# Patient Record
Sex: Male | Born: 1970 | Race: White | Hispanic: No | Marital: Single | Smoking: Current every day smoker
Health system: Southern US, Community
[De-identification: ages and names within clinical notes are randomized; demographics above are authoritative.]

## PROBLEM LIST (undated history)

## (undated) DIAGNOSIS — R7301 Impaired fasting glucose: Secondary | ICD-10-CM

## (undated) DIAGNOSIS — I10 Essential (primary) hypertension: Secondary | ICD-10-CM

## (undated) DIAGNOSIS — K5900 Constipation, unspecified: Secondary | ICD-10-CM

## (undated) DIAGNOSIS — F251 Schizoaffective disorder, depressive type: Secondary | ICD-10-CM

## (undated) DIAGNOSIS — H25012 Cortical age-related cataract, left eye: Secondary | ICD-10-CM

## (undated) DIAGNOSIS — Z720 Tobacco use: Secondary | ICD-10-CM

## (undated) DIAGNOSIS — M5136 Other intervertebral disc degeneration, lumbar region: Secondary | ICD-10-CM

## (undated) DIAGNOSIS — F200 Paranoid schizophrenia: Secondary | ICD-10-CM

## (undated) HISTORY — DX: Constipation, unspecified: K59.00

## (undated) HISTORY — DX: Paranoid schizophrenia: F20.0

## (undated) HISTORY — DX: Essential (primary) hypertension: I10

## (undated) HISTORY — DX: Schizoaffective disorder, depressive type: F25.1

## (undated) HISTORY — DX: Cortical age-related cataract, left eye: H25.012

## (undated) HISTORY — PX: APPENDECTOMY: SHX54

## (undated) HISTORY — DX: Impaired fasting glucose: R73.01

## (undated) HISTORY — DX: Tobacco use: Z72.0

## (undated) HISTORY — DX: Other intervertebral disc degeneration, lumbar region: M51.36

---

## 2003-03-14 ENCOUNTER — Inpatient Hospital Stay (HOSPITAL_COMMUNITY): Admission: EM | Admit: 2003-03-14 | Discharge: 2003-03-24 | Payer: Self-pay | Admitting: Psychiatry

## 2013-03-10 DIAGNOSIS — R7301 Impaired fasting glucose: Secondary | ICD-10-CM

## 2013-03-10 HISTORY — DX: Impaired fasting glucose: R73.01

## 2014-07-11 DIAGNOSIS — H25012 Cortical age-related cataract, left eye: Secondary | ICD-10-CM

## 2014-07-11 DIAGNOSIS — H2512 Age-related nuclear cataract, left eye: Secondary | ICD-10-CM | POA: Insufficient documentation

## 2014-07-11 HISTORY — DX: Cortical age-related cataract, left eye: H25.012

## 2014-07-13 DIAGNOSIS — H52222 Regular astigmatism, left eye: Secondary | ICD-10-CM | POA: Insufficient documentation

## 2014-07-13 DIAGNOSIS — IMO0002 Reserved for concepts with insufficient information to code with codable children: Secondary | ICD-10-CM | POA: Insufficient documentation

## 2017-04-09 ENCOUNTER — Ambulatory Visit (INDEPENDENT_AMBULATORY_CARE_PROVIDER_SITE_OTHER): Payer: Medicare Other | Admitting: Physician Assistant

## 2017-04-09 ENCOUNTER — Encounter: Payer: Self-pay | Admitting: Physician Assistant

## 2017-04-09 VITALS — BP 130/82 | HR 86 | Temp 98.1°F | Ht 70.0 in | Wt 172.0 lb

## 2017-04-09 DIAGNOSIS — R1032 Left lower quadrant pain: Secondary | ICD-10-CM

## 2017-04-09 DIAGNOSIS — Z7689 Persons encountering health services in other specified circumstances: Secondary | ICD-10-CM | POA: Diagnosis not present

## 2017-04-09 DIAGNOSIS — G8929 Other chronic pain: Secondary | ICD-10-CM | POA: Insufficient documentation

## 2017-04-09 DIAGNOSIS — K921 Melena: Secondary | ICD-10-CM

## 2017-04-09 DIAGNOSIS — Z8639 Personal history of other endocrine, nutritional and metabolic disease: Secondary | ICD-10-CM

## 2017-04-09 DIAGNOSIS — Z5181 Encounter for therapeutic drug level monitoring: Secondary | ICD-10-CM | POA: Diagnosis not present

## 2017-04-09 DIAGNOSIS — F209 Schizophrenia, unspecified: Secondary | ICD-10-CM | POA: Insufficient documentation

## 2017-04-09 DIAGNOSIS — F17200 Nicotine dependence, unspecified, uncomplicated: Secondary | ICD-10-CM | POA: Insufficient documentation

## 2017-04-09 DIAGNOSIS — F251 Schizoaffective disorder, depressive type: Secondary | ICD-10-CM | POA: Diagnosis not present

## 2017-04-09 DIAGNOSIS — Z13220 Encounter for screening for lipoid disorders: Secondary | ICD-10-CM

## 2017-04-09 HISTORY — DX: Schizoaffective disorder, depressive type: F25.1

## 2017-04-09 LAB — POCT URINALYSIS DIPSTICK
BILIRUBIN UA: NEGATIVE
Blood, UA: NEGATIVE
GLUCOSE UA: NEGATIVE
Ketones, UA: NEGATIVE
LEUKOCYTES UA: NEGATIVE
Nitrite, UA: NEGATIVE
Protein, UA: NEGATIVE
SPEC GRAV UA: 1.01 (ref 1.010–1.025)
Urobilinogen, UA: 0.2 E.U./dL
pH, UA: 7 (ref 5.0–8.0)

## 2017-04-09 NOTE — Progress Notes (Signed)
HPI:                                                                Darius MerlWalter R Lancour Jr. is a 47 y.o. male who [facility]: Primary Care Sports Medicine today to establish care  Current concerns include: abdominal pain  Patient c/o chronic left-sided abdominal pain since August 2017. States pain is worsening. Pain is constant, dull pain, 7/10. Endorses occasional sharp pain. Aggravated by laying on his stomach, bending forward, and heat. Not worsened by activity. Reports hematochezia about 1 month ago, otherwise no change in bowel habits. Has been taking Meloxicam for 2 years. Reports regular BM daily. Does not drink alcohol. Endorses chills. Drinks > 10 cups of coffee/soda per day. Smokes 1.5 packs per day He had a negative CT abdomen/pelvis on 12/01/2015  Followed by [facility] for schizoaffective disorder. Taking Depakote and Cymbalta. He brought a prescription for labs to be ordered and faxed to his psychiatrist for medication monitoring.  Depression screen PHQ 2/9 04/09/2017  Decreased Interest 0  Down, Depressed, Hopeless 0  PHQ - 2 Score 0     Past Medical History:  Diagnosis Date  . Cataract cortical, senile, left 07/11/2014  . Constipation   . IFG (impaired fasting glucose) 03/10/2013  . Lumbar degenerative disc disease 04/15/2017  . Paranoid schizophrenia (HCC)   . Schizoaffective disorder, depressive type (HCC) 04/09/2017  . Tobacco use    History reviewed. No pertinent surgical history. Social History   Tobacco Use  . Smoking status: Current Every Day Smoker    Packs/day: 1.50    Years: 30.00    Pack years: 45.00    Types: Cigarettes  . Smokeless tobacco: Never Used  Substance Use Topics  . Alcohol use: No    Frequency: Never   family history includes Diabetes in his mother; Hypertension in his father.  ROS: negative except as noted in the HPI  Medications: Current Outpatient Medications  Medication Sig Dispense Refill   . divalproex (DEPAKOTE ER) 500 MG 24 hr tablet Take 1 tablet by mouth once.    . DULoxetine (CYMBALTA) 30 MG capsule One capsule daily in addition to 60mg  capsule for total daily dose of 90mg     . DULoxetine (CYMBALTA) 60 MG capsule Take 60 mg by mouth daily.    Marland Kitchen. EPINEPHrine 0.3 mg/0.3 mL IJ SOAJ injection Inject into the muscle.    . paliperidone (INVEGA) 6 MG 24 hr tablet Take 6 mg by mouth daily.    Marland Kitchen. BANOPHEN 50 MG capsule Take 1 capsule by mouth at bedtime.  0  . esomeprazole (NEXIUM) 40 MG capsule One tab by mouth at dinner time. 90 capsule 3  . gabapentin (NEURONTIN) 100 MG capsule Take 1 capsule by mouth 3 (three) times daily.  0  . polyethylene glycol (MIRALAX / GLYCOLAX) packet Take 17 g by mouth 2 (two) times daily. Until stooling regularly 30 packet 11  . senna-docusate (SENOKOT-S) 8.6-50 MG tablet Take 2 tablets by mouth 2 (two) times daily. Until stooling regularly 60 tablet 0   No current facility-administered medications for this visit.    Allergies  Allergen Reactions  . Bee Venom Anaphylaxis  . Diclofenac Sodium     GI Bleed  Objective:  BP 130/82   Pulse 86   Temp 98.1 F (36.7 C) (Oral)   Ht 5\' 10"  (1.778 m)   Wt 172 lb (78 kg)   SpO2 95%   BMI 24.68 kg/m  Gen:  alert, not ill-appearing, no distress, appropriate for age HEENT: head normocephalic without obvious abnormality, conjunctiva and cornea clear, trachea midline Pulm: Normal work of breathing, normal phonation, clear to auscultation bilaterally, no wheezes, rales or rhonchi CV: Normal rate, regular rhythm, s1 and s2 distinct, no murmurs, clicks or rubs  GI: abdomen normal appearing, bowel sounds active, abdomen soft, there is point tenderness of the left lumbar region, no rebound, no guarding, no organomegaly Neuro: alert and oriented x 3, no tremor MSK: extremities atraumatic, normal gait and station Skin: intact, no rashes on exposed skin, no jaundice, no cyanosis  Acute Interface,  Incoming Rad Results - 12/01/2015 12:53 AM EDT COMPARISON: None  INDICATION: right flank pain, trauma  TECHNIQUE: Axial CT abdomen and pelvis with IV contrast, utilizing 80 mL of Isovue-370 Sagittal and coronal reconstructions generated and reviewed.  Dose reduction was utilized (automated exposure control, mA or kV adjustment based on patient size, or iterative image reconstruction).   FINDINGS:   ABDOMEN AND PELVIS:  Lower chest: Mild dependent atelectasis  Liver: Mild fatty infiltration of the liver  Gallbladder and biliary tree: Unremarkable  Pancreas: Unremarkable  Spleen: Unremarkable  Adrenals: Unremarkable  Kidneys and urinary bladder: Unremarkable  Reproductive: Unremarkable  Abdominal aorta: Mild atherosclerotic disease  Lymph nodes: Unremarkable  Peritoneum: No free fluid. No free intraperitoneal air.  Gastrointestinal tract: Unremarkable. Appendectomy.  Abdominal and pelvic walls: Unremarkable  Bones: Mild multilevel degenerative changes    IMPRESSION:  1.  Negative for acute injury or acute process.  No results found for this or any previous visit (from the past 72 hour(s)). No results found.    Assessment and Plan: 47 y.o. male with   1. Encounter to establish care - reviewed PMH, PSH, PFH, medications and allergies - reviewed health maintenance - Tdap UTD - declines influenza - personally reviewed labs and imaging results from 2017 in Care Everywhere  2. Schizoaffective disorder, depressive type (HCC) - labs results to be faxed to [facility] - COMPLETE METABOLIC PANEL WITH GFR - CBC with Differential - Vitamin D (25 hydroxy); Future - TSH + free T4 - Valproic acid level  3. Encounter for medication monitoring - COMPLETE METABOLIC PANEL WITH GFR - CBC with Differential - Vitamin D (25 hydroxy); Future - TSH + free T4 - Valproic acid level  4. History of vitamin D deficiency - Vitamin D (25  hydroxy); Future  5. Left lower quadrant pain - given recent episode of hematochezia and worsening symptoms, cannot r/o diverticulitis.  - CT Abdomen Pelvis W Contrast; Future - Lipase - POCT Urinalysis Dipstick  6. Screening, lipid - Lipid Panel w/reflex Direct LDL  7. Hematochezia - CBC with Differential - CT Abdomen Pelvis W Contrast; Future  8. Tobacco use disorder - declines smoking cessation   Patient education and anticipatory guidance given Patient agrees with treatment plan Follow-up in 1 week for abdominal pain or sooner as needed if symptoms worsen or fail to improve  Levonne Hubert PA-C

## 2017-04-09 NOTE — Patient Instructions (Addendum)
-   Stop meloxicam and avoid all over-the-counter pain relievers for now - Set up appointment with Sports Medicine to discuss arthritis/joint pain - CT abdomen will be scheduled - Labs today - Complete stool cards and return to office - Reduce your caffeine intake

## 2017-04-10 ENCOUNTER — Encounter (HOSPITAL_BASED_OUTPATIENT_CLINIC_OR_DEPARTMENT_OTHER): Payer: Self-pay

## 2017-04-10 ENCOUNTER — Encounter: Payer: Self-pay | Admitting: Physician Assistant

## 2017-04-10 ENCOUNTER — Ambulatory Visit (HOSPITAL_BASED_OUTPATIENT_CLINIC_OR_DEPARTMENT_OTHER)
Admission: RE | Admit: 2017-04-10 | Discharge: 2017-04-10 | Disposition: A | Discharge status: Home / Self Care | Payer: Medicare Other | Source: Ambulatory Visit | Attending: Physician Assistant | Admitting: Physician Assistant

## 2017-04-10 DIAGNOSIS — R1032 Left lower quadrant pain: Secondary | ICD-10-CM | POA: Diagnosis present

## 2017-04-10 LAB — VALPROIC ACID LEVEL: VALPROIC ACID LVL: 18.9 mg/L — AB (ref 50.0–100.0)

## 2017-04-10 LAB — CBC WITH DIFFERENTIAL/PLATELET
BASOS ABS: 20 {cells}/uL (ref 0–200)
Basophils Relative: 0.3 %
EOS ABS: 79 {cells}/uL (ref 15–500)
Eosinophils Relative: 1.2 %
HEMATOCRIT: 39.6 % (ref 38.5–50.0)
HEMOGLOBIN: 13.8 g/dL (ref 13.2–17.1)
LYMPHS ABS: 1921 {cells}/uL (ref 850–3900)
MCH: 30.2 pg (ref 27.0–33.0)
MCHC: 34.8 g/dL (ref 32.0–36.0)
MCV: 86.7 fL (ref 80.0–100.0)
MONOS PCT: 6.2 %
MPV: 9.5 fL (ref 7.5–12.5)
NEUTROS ABS: 4171 {cells}/uL (ref 1500–7800)
Neutrophils Relative %: 63.2 %
Platelets: 346 10*3/uL (ref 140–400)
RBC: 4.57 10*6/uL (ref 4.20–5.80)
RDW: 13 % (ref 11.0–15.0)
Total Lymphocyte: 29.1 %
WBC mixed population: 409 cells/uL (ref 200–950)
WBC: 6.6 10*3/uL (ref 3.8–10.8)

## 2017-04-10 LAB — COMPLETE METABOLIC PANEL WITH GFR
AG RATIO: 1.9 (calc) (ref 1.0–2.5)
ALT: 8 U/L — AB (ref 9–46)
AST: 14 U/L (ref 10–40)
Albumin: 4.6 g/dL (ref 3.6–5.1)
Alkaline phosphatase (APISO): 53 U/L (ref 40–115)
BUN/Creatinine Ratio: 6 (calc) (ref 6–22)
BUN: 5 mg/dL — ABNORMAL LOW (ref 7–25)
CALCIUM: 9.7 mg/dL (ref 8.6–10.3)
CO2: 30 mmol/L (ref 20–32)
CREATININE: 0.79 mg/dL (ref 0.60–1.35)
Chloride: 102 mmol/L (ref 98–110)
GFR, EST AFRICAN AMERICAN: 123 mL/min/{1.73_m2} (ref >=60)
GFR, Est Non African American: 106 mL/min/{1.73_m2} (ref >=60)
Globulin: 2.4 g/dL (calc) (ref 1.9–3.7)
Glucose, Bld: 99 mg/dL (ref 65–99)
POTASSIUM: 4.4 mmol/L (ref 3.5–5.3)
Sodium: 139 mmol/L (ref 135–146)
TOTAL PROTEIN: 7 g/dL (ref 6.1–8.1)
Total Bilirubin: 0.3 mg/dL (ref 0.2–1.2)

## 2017-04-10 LAB — LIPID PANEL W/REFLEX DIRECT LDL
Cholesterol: 113 mg/dL (ref <200)
HDL: 29 mg/dL — AB (ref >40)
LDL CHOLESTEROL (CALC): 60 mg/dL
NON-HDL CHOLESTEROL (CALC): 84 mg/dL (ref <130)
TRIGLYCERIDES: 160 mg/dL — AB (ref <150)
Total CHOL/HDL Ratio: 3.9 (calc) (ref <5.0)

## 2017-04-10 LAB — LIPASE: Lipase: 24 U/L (ref 7–60)

## 2017-04-10 LAB — TSH+FREE T4: TSH W/REFLEX TO FT4: 2.17 mIU/L (ref 0.40–4.50)

## 2017-04-10 MED ORDER — IOPAMIDOL (ISOVUE-300) INJECTION 61%
100.0000 mL | Freq: Once | INTRAVENOUS | Status: AC | PRN
  Administered 2017-04-10: 100 mL via INTRAVENOUS

## 2017-04-10 NOTE — Progress Notes (Signed)
Labs look great Abdominal CT was normal. It showed some arthritis of the spine and hips. There is no hernia. 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 Karie Schwalbe He will do an ultrasound guided injection of the painful area with steroid and pain medication. Please schedule appt  Also, please fax lab results to Psychiatrist (see snapshot specialty comments)

## 2017-04-15 ENCOUNTER — Encounter: Payer: Self-pay | Admitting: Sports Medicine

## 2017-04-15 ENCOUNTER — Ambulatory Visit (INDEPENDENT_AMBULATORY_CARE_PROVIDER_SITE_OTHER): Payer: Medicare Other | Admitting: Sports Medicine

## 2017-04-15 DIAGNOSIS — M51369 Other intervertebral disc degeneration, lumbar region without mention of lumbar back pain or lower extremity pain: Secondary | ICD-10-CM

## 2017-04-15 DIAGNOSIS — R1032 Left lower quadrant pain: Secondary | ICD-10-CM | POA: Diagnosis not present

## 2017-04-15 DIAGNOSIS — M5136 Other intervertebral disc degeneration, lumbar region: Secondary | ICD-10-CM | POA: Insufficient documentation

## 2017-04-15 DIAGNOSIS — G8929 Other chronic pain: Secondary | ICD-10-CM

## 2017-04-15 HISTORY — DX: Other intervertebral disc degeneration, lumbar region without mention of lumbar back pain or lower extremity pain: M51.369

## 2017-04-15 HISTORY — DX: Other intervertebral disc degeneration, lumbar region: M51.36

## 2017-04-15 MED ORDER — SENNOSIDES-DOCUSATE SODIUM 8.6-50 MG PO TABS: 2.0000 | ORAL_TABLET | Freq: Two times a day (BID) | ORAL | 0 refills | Status: DC

## 2017-04-15 MED ORDER — ESOMEPRAZOLE MAGNESIUM 40 MG PO CPDR: DELAYED_RELEASE_CAPSULE | ORAL | 3 refills | Status: DC

## 2017-04-15 MED ORDER — POLYETHYLENE GLYCOL 3350 17 G PO PACK
17.0000 g | PACK | Freq: Two times a day (BID) | ORAL | 11 refills | Status: DC
Start: 2017-04-15 — End: 2017-06-12

## 2017-04-15 NOTE — Progress Notes (Signed)
Subjective:    I'm seeing this patient as a consultation for: Darius Fray, PA-C  CC: Abdominal discomfort  HPI: This is a pleasant 47 year old male with a history of schizoaffective disorder on multiple antipsychotics.  Unfortunately for the past several months he has had pain in his left lower quadrant as well as some pain in the epigastrium, worse with ibuprofen.  Stools daily, somewhat hard, some straining.  He did have a set of routine labs that were unremarkable, as well as a CT of the abdomen and pelvis the results of which will be dictated below.  He also describes pain crossing the midline to the right side.  No hematochezia, melena, hematemesis.  No nausea.  There are no identifiable triggers or relieving factors.  I reviewed the past medical history, family history, social history, surgical history, and allergies today and no changes were needed.  Please see the problem list section below in epic for further details.  Past Medical History: Past Medical History:  Diagnosis Date  . Paranoid schizophrenia (HCC)    Past Surgical History: No past surgical history on file. Social History: Social History   Socioeconomic History  . Marital status: Legally Separated    Spouse name: None  . Number of children: None  . Years of education: None  . Highest education level: None  Social Needs  . Financial resource strain: None  . Food insecurity - worry: None  . Food insecurity - inability: None  . Transportation needs - medical: None  . Transportation needs - non-medical: None  Occupational History  . None  Tobacco Use  . Smoking status: Current Every Day Smoker    Packs/day: 1.50    Years: 30.00    Pack years: 45.00    Types: Cigarettes  . Smokeless tobacco: Never Used  Substance and Sexual Activity  . Alcohol use: No    Frequency: Never  . Drug use: No  . Sexual activity: Yes    Birth control/protection: None  Other Topics Concern  . None  Social History  Narrative  . None   Family History: Family History  Problem Relation Age of Onset  . Diabetes Mother   . Hypertension Father    Allergies: Allergies  Allergen Reactions  . Bee Venom Anaphylaxis  . Diclofenac Sodium     GI Bleed   Medications: See med rec.  Review of Systems: No headache, visual changes, nausea, vomiting, diarrhea, constipation, dizziness, abdominal pain, skin rash, fevers, chills, night sweats, weight loss, swollen lymph nodes, body aches, joint swelling, muscle aches, chest pain, shortness of breath, mood changes, visual or auditory hallucinations.   Objective:   General: Well Developed, well nourished, and in no acute distress.  Neuro:  Extra-ocular muscles intact, able to move all 4 extremities, sensation grossly intact.  Deep tendon reflexes tested were normal. Psych: Alert and oriented, mood congruent with affect. ENT:  Ears and nose appear unremarkable.  Hearing grossly normal. Neck: Unremarkable overall appearance, trachea midline.  No visible thyroid enlargement. Eyes: Conjunctivae and lids appear unremarkable.  Pupils equal and round. Skin: Warm and dry, no rashes noted.  Cardiovascular: Pulses palpable, no extremity edema. Abdomen: Soft, minimally tender in the left lower quadrant in the epigastrium, nondistended, normal bowel sounds, no palpable masses, no guarding, rigidity, rebound tenderness, no costovertebral angle tenderness.  CT personally reviewed, negative with the exception of a very large stool burden.  Impression and Recommendations:   This case required medical decision making of moderate complexity.  Abdominal  pain, chronic, left lower quadrant CT does show a very large stool burden. Also on antipsychotics, diphenhydramine, valproic acid all which can be constipating. Abdominal pain is bilateral, chronic, is likely due to chronic constipation. Adding MiraLAX to be taken twice a day, as well as Senokot S. He also has a bit of epigastric  pain that comes with NSAIDs, adding Nexium. Return to see me in 1 month.  Lumbar degenerative disc disease L4-L5 degenerative disc disease on CT. Having some flank pain. Adding formal physical therapy. ___________________________________________ Ihor Austinhomas J. Benjamin Stainhekkekandam, M.D., ABFM., CAQSM. Primary Care and Sports Medicine [facility]  Adjunct Instructor of Family Medicine  [employer]

## 2017-04-15 NOTE — Assessment & Plan Note (Signed)
L4-L5 degenerative disc disease on CT. Having some flank pain. Adding formal physical therapy.

## 2017-04-15 NOTE — Assessment & Plan Note (Signed)
CT does show a very large stool burden. Also on antipsychotics, diphenhydramine, valproic acid all which can be constipating. Abdominal pain is bilateral, chronic, is likely due to chronic constipation. Adding MiraLAX to be taken twice a day, as well as Senokot S. He also has a bit of epigastric pain that comes with NSAIDs, adding Nexium. Return to see me in 1 month.

## 2017-04-15 NOTE — Patient Instructions (Signed)

## 2017-04-16 ENCOUNTER — Ambulatory Visit: Payer: Medicare Other | Admitting: Physician Assistant

## 2017-04-17 ENCOUNTER — Telehealth: Payer: Self-pay

## 2017-04-17 NOTE — Telephone Encounter (Signed)
He can try to get Nexium over-the-counter, MiraLAX is also available over-the-counter, Senokot-S also available over-the-counter.  Try this first, if he cannot afford it or does not desire to do it this way I am happy to send in Amitiza again.

## 2017-04-17 NOTE — Telephone Encounter (Addendum)
Patient called in stating the following medication that Dr. Karie Schwalbe prescribed is not covered by his insurance Medicare: Nexium, Miralax and Senokot.   Patient states he was on Amitiza before for constipation and can try that again.

## 2017-04-18 ENCOUNTER — Ambulatory Visit (INDEPENDENT_AMBULATORY_CARE_PROVIDER_SITE_OTHER): Payer: Medicare Other | Admitting: Rehabilitative and Restorative Service Providers"

## 2017-04-18 DIAGNOSIS — R29898 Other symptoms and signs involving the musculoskeletal system: Secondary | ICD-10-CM

## 2017-04-18 DIAGNOSIS — M545 Low back pain, unspecified: Secondary | ICD-10-CM

## 2017-04-18 DIAGNOSIS — G8929 Other chronic pain: Secondary | ICD-10-CM
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
## 2017-04-18 NOTE — Patient Instructions (Signed)
Trunk: Prone Extension (Press-Ups)    Lie on stomach on firm, flat surface. Relax bottom and legs. Raise chest in air with elbows straight. Keep hips flat on surface, sag stomach. Hold __2__ seconds. Repeat __10__ times. Do _2___ sessions per day. CAUTION: Movement should be gentle and slow.   HIP: Hamstrings - Supine  Place strap around foot. Raise leg up, keeping knee straight.  Bend opposite knee to protect back if indicated. Hold 30 seconds. 3 reps per set, 2-3 sets per day  Outer Hip Stretch: Reclined IT Band Stretch (Strap)   Strap around one foot, pull leg across body until you feel a pull or stretch in the outside of your hip, with shoulders on mat. Hold for 30 seconds. Repeat 3 times each leg. 2-3 times/day.  Piriformis Stretch   Lying on back, pull right knee toward opposite shoulder. Hold 30 seconds. Repeat 3 times. Do 2-3 sessions per day.   Quads / HF, Supine   Lie near edge of bed, pull both knees up toward chest. Hold one knee as you drop the other leg off the edge of the bed.  Relax hanging knee/can bend knee back if indicated. Hold 30 seconds. Repeat 3 times per session. Do 2-3 sessions per day.  Quads / HF, Prone KNEE: Quadriceps - Prone    Place strap around ankle. Bring ankle toward buttocks. Press hip into surface. Hold 30 seconds. Repeat 3 times per session. Do 2-3 sessions per day.  Sleeping on Back  Place pillow under knees. A pillow with cervical support and a roll around waist are also helpful. Copyright  VHI. All rights reserved.  Sleeping on Side Place pillow between knees. Use cervical support under neck and a roll around waist as needed. Copyright  VHI. All rights reserved.   Sleeping on Stomach   If this is the only desirable sleeping position, place pillow under lower legs, and under stomach or chest as needed.  Posture - Sitting   Sit upright, head facing forward. Try using a roll to support lower back. Keep shoulders relaxed,  and avoid rounded back. Keep hips level with knees. Avoid crossing legs for long periods. Stand to Sit / Sit to Stand   To sit: Bend knees to lower self onto front edge of chair, then scoot back on seat. To stand: Reverse sequence by placing one foot forward, and scoot to front of seat. Use rocking motion to stand up.   Work Height and Reach  Ideal work height is no more than 2 to 4 inches below elbow level when standing, and at elbow level when sitting. Reaching should be limited to arm's length, with elbows slightly bent.  Bending  Bend at hips and knees, not back. Keep feet shoulder-width apart.    Posture - Standing   Good posture is important. Avoid slouching and forward head thrust. Maintain curve in low back and align ears over shoul- ders, hips over ankles.  Alternating Positions   Alternate tasks and change positions frequently to reduce fatigue and muscle tension. Take rest breaks. Computer Work   Position work to Art gallery manager. Use proper work and seat height. Keep shoulders back and down, wrists straight, and elbows at right angles. Use chair that provides full back support. Add footrest and lumbar roll as needed.  Getting Into / Out of Car  Lower self onto seat, scoot back, then bring in one leg at a time. Reverse sequence to get out.  Dressing  Lie on back to pull  socks or slacks over feet, or sit and bend leg while keeping back straight.    Housework - Sink  Place one foot on ledge of cabinet under sink when standing at sink for prolonged periods.   Pushing / Pulling  Pushing is preferable to pulling. Keep back in proper alignment, and use leg muscles to do the work.  Deep Squat   Squat and lift with both arms held against upper trunk. Tighten stomach muscles without holding breath. Use smooth movements to avoid jerking.  Avoid Twisting   Avoid twisting or bending back. Pivot around using foot movements, and bend at knees if needed when reaching  for articles.  Carrying Luggage   Distribute weight evenly on both sides. Use a cart whenever possible. Do not twist trunk. Move body as a unit.   Lifting Principles .Maintain proper posture and head alignment. .Slide object as close as possible before lifting. .Move obstacles out of the way. .Test before lifting; ask for help if too heavy. .Tighten stomach muscles without holding breath. .Use smooth movements; do not jerk. .Use legs to do the work, and pivot with feet. .Distribute the work load symmetrically and close to the center of trunk. .Push instead of pull whenever possible.   Ask For Help   Ask for help and delegate to others when possible. Coordinate your movements when lifting together, and maintain the low back curve.  Log Roll   Lying on back, bend left knee and place left arm across chest. Roll all in one movement to the right. Reverse to roll to the left. Always move as one unit. Housework - Sweeping  Use long-handled equipment to avoid stooping.   Housework - Wiping  Position yourself as close as possible to reach work surface. Avoid straining your back.  Laundry - Unloading Wash   To unload small items at bottom of washer, lift leg opposite to arm being used to reach.  Gardening - Raking  Move close to area to be raked. Use arm movements to do the work. Keep back straight and avoid twisting.     Cart  When reaching into cart with one arm, lift opposite leg to keep back straight.   Getting Into / Out of Bed  Lower self to lie down on one side by raising legs and lowering head at the same time. Use arms to assist moving without twisting. Bend both knees to roll onto back if desired. To sit up, start from lying on side, and use same move-ments in reverse. Housework - Vacuuming  Hold the vacuum with arm held at side. Step back and forth to move it, keeping head up. Avoid twisting.   Laundry - Armed forces training and education officer so that  bending and twisting can be avoided.   Laundry - Unloading Dryer  Squat down to reach into clothes dryer or use a reacher.  Gardening - Weeding / Psychiatric nurse or Kneel. Knee pads may be helpful.                    TENS UNIT: This is helpful for muscle pain and spasm.   Search and Purchase a TENS 7000 2nd edition at www.tenspros.com. It should be less than $30.     TENS unit instructions: Do not shower or bathe with the unit on Turn the unit off before removing electrodes or batteries If the electrodes lose stickiness add a drop of water to the electrodes after they are disconnected from the unit  and place on plastic sheet. If you continued to have difficulty, call the TENS unit company to purchase more electrodes. Do not apply lotion on the skin area prior to use. Make sure the skin is clean and dry as this will help prolong the life of the electrodes. After use, always check skin for unusual red areas, rash or other skin difficulties. If there are any skin problems, does not apply electrodes to the same area. Never remove the electrodes from the unit by pulling the wires. Do not use the TENS unit or electrodes other than as directed. Do not change electrode placement without consultating your therapist or physician. Keep 2 fingers with between each electrode.

## 2017-04-20 ENCOUNTER — Encounter: Payer: Self-pay | Admitting: Physician Assistant

## 2017-04-23 MED ORDER — LUBIPROSTONE 8 MCG PO CAPS: 8.0000 ug | ORAL_CAPSULE | Freq: Two times a day (BID) | ORAL | 3 refills | Status: DC

## 2017-04-23 NOTE — Telephone Encounter (Signed)
Called patient - LMOVM Rx Amitiza electronically sent to pharmacy; provider notations; to call office if he has any additional questions or concerns.

## 2017-04-23 NOTE — Telephone Encounter (Signed)
Called patient - advised of provider's recommendations - Patient states he would prefer Rx Amitiza to be electronically sent to [facility].

## 2017-04-23 NOTE — Telephone Encounter (Signed)
I have called in the low-dose, he can double his dose if desired.

## 2017-04-24 ENCOUNTER — Encounter: Payer: Medicare Other | Admitting: Physical Therapy

## 2017-04-24 NOTE — Telephone Encounter (Signed)
Pt states the Amitiza is over $100, requesting a generic. Advised Pt to contact insurance and see what they prefer.

## 2017-04-24 NOTE — Telephone Encounter (Signed)
Yes, remind him there is no generic for Amitiza.  He should do the over-the-counter MiraLAX, Nexium, and Senokot S as initially discussed.

## 2017-04-24 NOTE — Telephone Encounter (Signed)
Called patient - advised of provider notation. Patient stated he understood and had no further questions at this time.

## 2017-04-28 ENCOUNTER — Ambulatory Visit (INDEPENDENT_AMBULATORY_CARE_PROVIDER_SITE_OTHER): Payer: Medicare Other | Admitting: Physical Therapy

## 2017-04-28 ENCOUNTER — Encounter: Payer: Self-pay | Admitting: Physical Therapy

## 2017-04-28 DIAGNOSIS — M545 Low back pain: Secondary | ICD-10-CM | POA: Diagnosis present

## 2017-04-28 DIAGNOSIS — R29898 Other symptoms and signs involving the musculoskeletal system: Secondary | ICD-10-CM | POA: Diagnosis not present

## 2017-04-28 DIAGNOSIS — G8929 Other chronic pain: Secondary | ICD-10-CM | POA: Diagnosis not present

## 2017-04-28 NOTE — Therapy (Signed)
[facility] [place], [place], [address] Phone: [phone]   Fax:  [phone]  Physical Therapy Treatment  Patient Details  Name: Darius Campos. MRN: [id number] Date of Birth: 12-18-1970 Referring Provider: Dr Dianah Field    Encounter Date: 04/28/2017  PT End of Session - 04/28/17 0930    Visit Number  2    Number of Visits  12    Date for PT Re-Evaluation  05/30/17    PT Start Time  0930    PT Stop Time  1025    PT Time Calculation (min)  55 min    Activity Tolerance  Patient tolerated treatment well       Past Medical History:  Diagnosis Date  . Cataract cortical, senile, left 07/11/2014  . Constipation   . IFG (impaired fasting glucose) 03/10/2013  . Lumbar degenerative disc disease 04/15/2017  . Paranoid schizophrenia ([place])   . Schizoaffective disorder, depressive type ([place]) 04/09/2017  . Tobacco use     History reviewed. No pertinent surgical history.  There were no vitals filed for this visit.  Subjective Assessment - 04/28/17 0931    Subjective  Pt reports     Currently in Pain?  Yes    Pain Score  3     Pain Location  Back    Pain Orientation  Mid;Lower    Pain Descriptors / Indicators  Sharp    Pain Type  Chronic pain    Pain Onset  More than a month ago    Pain Frequency  Constant    Aggravating Factors   bending over and picking up heavy objects    Pain Relieving Factors  hot bath                      [facility] Adult PT Treatment/Exercise - 04/28/17 0001      Lumbar Exercises: Stretches   Other Lumbar Stretch Exercise  childs pose to stretch out low back.       Lumbar Exercises: Prone   Straight Leg Raise  10 reps each side with VC for pelvic press    Other Prone Lumbar Exercises  5 reps pelvic press, 10 with bilat knee flex/ext      Modalities   Modalities  Electrical Stimulation;Moist Heat;Ultrasound      Moist Heat Therapy   Number Minutes Moist Heat   15 Minutes    Moist Heat Location  Lumbar Spine      Electrical Stimulation   Electrical Stimulation Location  bilat lumbosacral spine     Electrical Stimulation Action  IFC    Electrical Stimulation Parameters  to tolerance    Electrical Stimulation Goals  Pain;Tone      Ultrasound   Ultrasound Location  bilat lower lumbar paraspinal    Ultrasound Parameters  combo us/stim 100%, 1.79mz, 1.5w/cm2    Ultrasound Goals  Pain;Other (Comment) tone      Manual Therapy   Manual Therapy  Joint mobilization;Soft tissue mobilization    Joint Mobilization  grade III mobs lumbar spine CPA bilat UPA with focus on CPA L3, some increased mobility and decrease pain with treatment    Soft tissue mobilization  bilat lumbar paraspinals STM and TPR ,focus at iliac crest.                   PT Long Term Goals - 04/28/17 0946      PT LONG TERM GOAL #1   Title  Inprove lumbar and LE mobility and ROM to WFL's and minimal pain with trunk motioins 05/30/17    Status  On-going      PT LONG TERM GOAL #2   Title  Increase strength bilat hip extension to 5/5 with no pain with resistive testing 05/30/17    Status  On-going      PT LONG TERM GOAL #3   Title  Increase sitting; standing; walking tolerance to 10-15 min 05/30/17    Status  On-going      PT LONG TERM GOAL #4   Title  Independent in HEP 05/30/17    Status  On-going      PT LONG TERM GOAL #5   Title  Improve FOTO to </= 33% limitation 05/30/17    Status  On-going            Plan - 04/28/17 0947    Clinical Impression Statement  This is Virl Cagey second visit, no goals met,  He has not been able to perform HEP as often as requested due to being busy.  He is not able to contract the Lt multifidi as strong as the Rt with pelvic presses. Tightness in the lumbar musculature and spine was improved at the end of treatment, pt reported about a 50% imporvement in symptoms    Rehab Potential  Good    PT Frequency  2x / week    PT Duration  6 weeks     PT Treatment/Interventions  Patient/family education;ADLs/Self Care Home Management;Cryotherapy;Electrical Stimulation;Iontophoresis '4mg'$ /ml Dexamethasone;Moist Heat;Ultrasound;Traction;Dry needling;Manual techniques;Neuromuscular re-education;Therapeutic activities;Therapeutic exercise    PT Next Visit Plan  review HEP; add three part core; progress with core stabilization; manual work and modalities as indicated     Consulted and Agree with Plan of Care  Patient       Patient will benefit from skilled therapeutic intervention in order to improve the following deficits and impairments:  Postural dysfunction, Improper body mechanics, Pain, Increased fascial restricitons, Increased muscle spasms, Hypomobility, Decreased mobility, Decreased range of motion, Decreased strength, Decreased activity tolerance  Visit Diagnosis: Chronic bilateral low back pain without sciatica  Other symptoms and signs involving the musculoskeletal system     Problem List Patient Active Problem List   Diagnosis Date Noted  . Lumbar degenerative disc disease 04/15/2017  . Schizoaffective disorder, depressive type ([facility]) 04/09/2017  . Encounter for medication monitoring 04/09/2017  . Abdominal pain, chronic, left lower quadrant 04/09/2017  . Hematochezia 04/09/2017  . Tobacco use disorder 04/09/2017  . Regular astigmatism of left eye 07/13/2014  . PSC (posterior subcapsular cataract), left 07/13/2014  . IFG (impaired fasting glucose) 03/10/2013    Jeral Pinch PT  04/28/2017, 10:13 AM  [facility] [place], [place], [address] Phone: [phone]   Fax:  [phone]  Name: Darius Campos. MRN: [id number] Date of Birth: 11-03-1970

## 2017-05-01 ENCOUNTER — Ambulatory Visit (INDEPENDENT_AMBULATORY_CARE_PROVIDER_SITE_OTHER): Payer: Medicare Other | Admitting: Physical Therapy

## 2017-05-01 DIAGNOSIS — M545 Low back pain, unspecified: Secondary | ICD-10-CM

## 2017-05-01 DIAGNOSIS — R29898 Other symptoms and signs involving the musculoskeletal system: Secondary | ICD-10-CM

## 2017-05-01 DIAGNOSIS — G8929 Other chronic pain: Secondary | ICD-10-CM

## 2017-05-01 NOTE — Therapy (Signed)
[facility] [address] [place], [place], [address] Phone: [phone]   Fax:  [phone]  Physical Therapy Treatment  Patient Details  Name: Darius Campos. MRN: [id number] Date of Birth: April 07, 1970 Referring Provider: Dr. Benjamin Stainhekkekandam   Encounter Date: 05/01/2017  PT End of Session - 05/01/17 1454    Visit Number  3    Number of Visits  12    Date for PT Re-Evaluation  05/30/17    PT Start Time  1449    PT Stop Time  1542    PT Time Calculation (min)  53 min    Activity Tolerance  Patient tolerated treatment well;No increased pain    Behavior During Therapy  WFL for tasks assessed/performed       Past Medical History:  Diagnosis Date  . Cataract cortical, senile, left 07/11/2014  . Constipation   . IFG (impaired fasting glucose) 03/10/2013  . Lumbar degenerative disc disease 04/15/2017  . Paranoid schizophrenia ([facility])   . Schizoaffective disorder, depressive type ([facility]) 04/09/2017  . Tobacco use     No past surgical history on file.  There were no vitals filed for this visit.  Subjective Assessment - 05/01/17 1454    Subjective  Pt reports he has been trying to take it easy lately.  He has not been able to do exercises since he is too busy running errands, doing chores and "running around with girlfriend".      Currently in Pain?  Yes    Pain Score  1     Pain Location  Back    Pain Orientation  Lower;Left;Right    Pain Descriptors / Indicators  Aching    Aggravating Factors   bending over to pick up items    Pain Relieving Factors  Heat.          [facility] - 05/01/17 0001      Assessment   Medical Diagnosis  Chronic LBP; lumbar DDD     Referring Provider  Dr. Beryle Beamshekkekandam       [facility] Adult PT Treatment/Exercise - 05/01/17 0001      Lumbar Exercises: Stretches   Passive Hamstring Stretch  Right;Left;4 reps;30 seconds supine x 3 reps; seated 1 rep    Lower Trunk Rotation  -- 10 x, 5  sec pause; arms Y and T.    Hip Flexor Stretch  Right;Left;1 rep;30 seconds seated with leg off edge of chair    Standing Extension  5 seconds;3 reps    Piriformis Stretch  Right;Left;2 reps;30 seconds supine travell x1, fig 4 x 1    Figure 4 Stretch  1 rep;20 seconds seated    Other Lumbar Stretch Exercise  quad stretch in standing x 30 sec x 2 reps each side.       Lumbar Exercises: Aerobic   Nustep  L5: 6.5 min  PTA present to discusss progress      Modalities   Modalities  Electrical Stimulation;Moist Heat      Moist Heat Therapy   Number Minutes Moist Heat  15 Minutes    Moist Heat Location  Lumbar Spine      Electrical Stimulation   Electrical Stimulation Location  bilat lumbosacral paraspinals    Electrical Stimulation Action  IFC    Electrical Stimulation Parameters  to tolerance     Electrical Stimulation Goals  Tone;Pain                  PT Long  Term Goals - 04/28/17 0946      PT LONG TERM GOAL #1   Title  Inprove lumbar and LE mobility and ROM to WFL's and minimal pain with trunk motioins 05/30/17    Status  On-going      PT LONG TERM GOAL #2   Title  Increase strength bilat hip extension to 5/5 with no pain with resistive testing 05/30/17    Status  On-going      PT LONG TERM GOAL #3   Title  Increase sitting; standing; walking tolerance to 10-15 min 05/30/17    Status  On-going      PT LONG TERM GOAL #4   Title  Independent in HEP 05/30/17    Status  On-going      PT LONG TERM GOAL #5   Title  Improve FOTO to </= 33% limitation 05/30/17    Status  On-going            Plan - 05/01/17 1527    Clinical Impression Statement  Pt reported non-compliance with HEP.  Pt given alternative versions of stretches and encouragement to assist with improved compliance.  Pt tolerated all exercises well, reporting decreased back pain afterward.  Further reduction after use of MHP / estim.      Rehab Potential  Good    PT Frequency  2x / week    PT Duration  6  weeks    PT Treatment/Interventions  Patient/family education;ADLs/Self Care Home Management;Cryotherapy;Electrical Stimulation;Iontophoresis 4mg /ml Dexamethasone;Moist Heat;Ultrasound;Traction;Dry needling;Manual techniques;Neuromuscular re-education;Therapeutic activities;Therapeutic exercise    PT Next Visit Plan  review HEP; add three part core; progress with core stabilization; manual work and modalities as indicated     Consulted and Agree with Plan of Care  Patient       Patient will benefit from skilled therapeutic intervention in order to improve the following deficits and impairments:  Postural dysfunction, Improper body mechanics, Pain, Increased fascial restricitons, Increased muscle spasms, Hypomobility, Decreased mobility, Decreased range of motion, Decreased strength, Decreased activity tolerance  Visit Diagnosis: Chronic bilateral low back pain without sciatica  Other symptoms and signs involving the musculoskeletal system     Problem List Patient Active Problem List   Diagnosis Date Noted  . Lumbar degenerative disc disease 04/15/2017  . Schizoaffective disorder, depressive type ([facility]) 04/09/2017  . Encounter for medication monitoring 04/09/2017  . Abdominal pain, chronic, left lower quadrant 04/09/2017  . Hematochezia 04/09/2017  . Tobacco use disorder 04/09/2017  . Regular astigmatism of left eye 07/13/2014  . PSC (posterior subcapsular cataract), left 07/13/2014  . IFG (impaired fasting glucose) 03/10/2013   Mayer Camel, PTA 05/01/17 3:37 PM  [facility] [place] [address] [place], [place], [address] Phone: [phone]   Fax:  [phone]  Name: Darius Campos. MRN: [id number] Date of Birth: May 18, 1970

## 2017-05-01 NOTE — Patient Instructions (Signed)
Quadriceps (Stand)    Stand holding onto stationary object. Grasp right ankle. Pull knee back. Keep back straight. Do not rotate or arch back. Hold __30__ seconds. Repeat __2_ times. Do __1__ sessions per day. CAUTION: Stretch should be gentle, steady and slow.   HIP: Hamstrings - Short Sitting    Rest leg on raised surface. Keep knee straight. Lift chest. Hold _30__ seconds. _2__ reps per set, _2__ sets per day.   Hip Stretch    Put right ankle over left knee. Let right knee fall downward, but keep ankle in place. Feel the stretch in hip. May push down gently with hand to feel stretch. Hold __30__ seconds while counting out loud. Repeat with other leg. Repeat __2__ times. Do __1-2__ sessions per day. Trunk Extension    Standing, place back of open hands on low back. Hold 2-3 seconds. Straighten spine then arch the back and move shoulders back. Repeat _2-3___ times per session. Do __5-7__ sessions per week. Variation: Seated  Copyright  VHI. All rights reserved.    Trunk: Rotation    Lie on back on firm, flat surface with knees bent. Keep head and shoulders flat, slowly lower knees to floor. May also do with legs straight. Lift one at a time up and across to touch floor. Hold __5__ seconds. Repeat __5__ times, alternating sides.  CAUTION: Movement should be gentle, steady and slow.

## 2017-05-05 ENCOUNTER — Encounter: Payer: Self-pay | Admitting: Physical Therapy

## 2017-05-05 ENCOUNTER — Ambulatory Visit (INDEPENDENT_AMBULATORY_CARE_PROVIDER_SITE_OTHER): Payer: Medicare Other | Admitting: Physical Therapy

## 2017-05-05 DIAGNOSIS — M545 Low back pain, unspecified: Secondary | ICD-10-CM

## 2017-05-05 DIAGNOSIS — G8929 Other chronic pain: Secondary | ICD-10-CM | POA: Diagnosis not present

## 2017-05-05 DIAGNOSIS — R29898 Other symptoms and signs involving the musculoskeletal system: Secondary | ICD-10-CM | POA: Diagnosis not present

## 2017-05-05 NOTE — Patient Instructions (Signed)
Bridging    Slowly raise buttocks from floor, keeping stomach tight. Repeat ____ times per set. Do ____ sets per session. Do ____ sessions per day.  core exercise    With neutral spine, tighten pelvic floor, then tighten abdominal muscles sucking your belly button to back bone. Hold 10 seconds. Repeat _10_ times. Do _several__ times a day.  * When you have mastered this, you can contract all 3 muscle groups at same time.   * Progress to do this activity sitting, standing, walking and functional activities.
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
## 2017-05-08 ENCOUNTER — Telehealth: Payer: Self-pay | Admitting: Physical Therapy

## 2017-05-08 ENCOUNTER — Encounter: Payer: Medicare Other | Admitting: Physical Therapy

## 2017-05-08 NOTE — Telephone Encounter (Signed)
Left voicemail regarding patient missing today's physical therapy appointment.  Requested patient call back to reschedule.   Mayer CamelJennifer Carlson-Long, PTA 05/08/17 3:35 PM

## 2017-06-12 ENCOUNTER — Encounter: Payer: Self-pay | Admitting: Physician Assistant

## 2017-06-12 ENCOUNTER — Ambulatory Visit (INDEPENDENT_AMBULATORY_CARE_PROVIDER_SITE_OTHER): Payer: Medicare Other | Admitting: Physician Assistant

## 2017-06-12 VITALS — BP 147/91 | HR 77 | Wt 167.0 lb

## 2017-06-12 DIAGNOSIS — M5136 Other intervertebral disc degeneration, lumbar region: Secondary | ICD-10-CM | POA: Diagnosis not present

## 2017-06-12 DIAGNOSIS — I10 Essential (primary) hypertension: Secondary | ICD-10-CM

## 2017-06-12 DIAGNOSIS — Z791 Long term (current) use of non-steroidal anti-inflammatories (NSAID): Secondary | ICD-10-CM | POA: Diagnosis not present

## 2017-06-12 DIAGNOSIS — F251 Schizoaffective disorder, depressive type: Secondary | ICD-10-CM

## 2017-06-12 DIAGNOSIS — K5909 Other constipation: Secondary | ICD-10-CM | POA: Diagnosis not present

## 2017-06-12 DIAGNOSIS — Z5181 Encounter for therapeutic drug level monitoring: Secondary | ICD-10-CM

## 2017-06-12 MED ORDER — LINACLOTIDE 145 MCG PO CAPS: 145.0000 ug | ORAL_CAPSULE | Freq: Every day | ORAL | 5 refills | Status: DC

## 2017-06-12 MED ORDER — LISINOPRIL 10 MG PO TABS: 10.0000 mg | ORAL_TABLET | Freq: Every day | ORAL | 5 refills | Status: DC

## 2017-06-12 MED ORDER — MELOXICAM 15 MG PO TABS: 15.0000 mg | ORAL_TABLET | Freq: Every day | ORAL | 5 refills | Status: DC

## 2017-06-12 MED ORDER — GABAPENTIN 300 MG PO CAPS: 300.0000 mg | ORAL_CAPSULE | Freq: Three times a day (TID) | ORAL | 5 refills | Status: DC

## 2017-06-12 MED ORDER — OMEPRAZOLE 20 MG PO CPDR: 20.0000 mg | DELAYED_RELEASE_CAPSULE | Freq: Every day | ORAL | 5 refills | Status: DC

## 2017-06-12 NOTE — Progress Notes (Signed)
HPI:                                                                Darius Campos. is a 47 y.o. male who presents to [facility]: Primary Care Sports Medicine today for medication management  Reports his psychiatrist has moved out of state and that he does not get along with his counselor. He is requesting multiple medication adjustments, including his antipsychotic. Mainly complains of anxiety, which he attributes to his current romantic relationship.  Constipation: currently taking Senna-docusate and Mirlax as needed. He is requesting to try Linzess. He has chronic LLQ abdominal pain, which is stable. Denies consitutional symptoms, nausea, vomiting, hematochezia or melena.  Chronic LBP: incidental finding of mild degenerative changes of hip/spine on recent CT abdomen. Complains of worsening bilateral low back pain. Currently in physical therapy and followed by sports medicine.  Depression screen PHQ 2/9 04/09/2017  Decreased Interest 0  Down, Depressed, Hopeless 0  PHQ - 2 Score 0    No flowsheet data found.    Past Medical History:  Diagnosis Date  . Cataract cortical, senile, left 07/11/2014  . Constipation   . IFG (impaired fasting glucose) 03/10/2013  . Lumbar degenerative disc disease 04/15/2017  . Paranoid schizophrenia ([facility])   . Schizoaffective disorder, depressive type ([facility]) 04/09/2017  . Tobacco use    No past surgical history on file. Social History   Tobacco Use  . Smoking status: Current Every Day Smoker    Packs/day: 1.50    Years: 30.00    Pack years: 45.00    Types: Cigarettes  . Smokeless tobacco: Never Used  Substance Use Topics  . Alcohol use: No    Frequency: Never   family history includes Diabetes in his mother; Hypertension in his father.    ROS: negative except as noted in the HPI  Medications: Current Outpatient Medications  Medication Sig Dispense Refill  . BANOPHEN 50 MG capsule Take 1 capsule by mouth at bedtime.   0  . divalproex (DEPAKOTE ER) 500 MG 24 hr tablet Take 1 tablet by mouth once.    . DULoxetine (CYMBALTA) 60 MG capsule Take 60 mg by mouth daily.    Marland Kitchen EPINEPHrine 0.3 mg/0.3 mL IJ SOAJ injection Inject into the muscle.    . gabapentin (NEURONTIN) 100 MG capsule Take 1 capsule by mouth 3 (three) times daily.  0  . paliperidone (INVEGA) 6 MG 24 hr tablet Take 6 mg by mouth daily.     No current facility-administered medications for this visit.    Allergies  Allergen Reactions  . Bee Venom Anaphylaxis  . Diclofenac Sodium     GI Bleed       Objective:  BP (!) 145/92   Pulse 77   Wt 167 lb (75.8 kg)   BMI 23.96 kg/m  Gen:  alert, not ill-appearing, no distress, appropriate for age HEENT: head normocephalic without obvious abnormality, conjunctiva and cornea clear, trachea midline Pulm: Normal work of breathing, normal phonation Neuro: alert and oriented x 3, no tremor MSK: extremities atraumatic, normal gait and station Skin: intact, no rashes on exposed skin, no jaundice, no cyanosis Psych: well-groomed, cooperative, good eye contact, euthymic mood, affect mood-congruent, speech is articulate, poor insight, there is psychomotor  agitation  Lab Results  Component Value Date   WBC 6.6 04/09/2017   HGB 13.8 04/09/2017   HCT 39.6 04/09/2017   MCV 86.7 04/09/2017   PLT 346 04/09/2017   Lab Results  Component Value Date   CREATININE 0.79 04/09/2017   BUN 5 (L) 04/09/2017   NA 139 04/09/2017   K 4.4 04/09/2017   CL 102 04/09/2017   CO2 30 04/09/2017   Lab Results  Component Value Date   ALT 8 (L) 04/09/2017   AST 14 04/09/2017   BILITOT 0.3 04/09/2017   No results found for: TSH   No results found for this or any previous visit (from the past 72 hour(s)). No results found.    Assessment and Plan: 47 y.o. male with   1. Schizoaffective disorder, depressive type (HCC) - Ambulatory referral to Psychiatry for CBT - Ambulatory referral to Psychiatry for  medication management  2. Chronic constipation - linaclotide (LINZESS) 145 MCG CAPS capsule; Take 1 capsule (145 mcg total) by mouth daily before breakfast.  Dispense: 30 capsule; Refill: 5  3. Lumbar degenerative disc disease - increasing Gabapentin from 100 mg to 300 mg tid. Refill of Meloxicam. Continue PT and keep follow-up with Sports Medicine - gabapentin (NEURONTIN) 300 MG capsule; Take 1 capsule (300 mg total) by mouth 3 (three) times daily.  Dispense: 90 capsule; Refill: 5 - meloxicam (MOBIC) 15 MG tablet; Take 1 tablet (15 mg total) by mouth daily.  Dispense: 30 tablet; Refill: 5  4. NSAID long-term use - omeprazole (PRILOSEC) 20 MG capsule; Take 1 capsule (20 mg total) by mouth daily.  Dispense: 30 capsule; Refill: 5  5. Encounter for medication monitoring - recent labs completed 04/09/2017  6. Hypertension goal BP (blood pressure) < 130/80 BP Readings from Last 3 Encounters:  06/12/17 (!) 147/91  04/15/17 132/77  04/09/17 130/82  - stage 1 HTN, starting ACE inhibitor. Counseled on therapeutic lifestyle changes - lisinopril (PRINIVIL,ZESTRIL) 10 MG tablet; Take 1 tablet (10 mg total) by mouth daily.  Dispense: 30 tablet; Refill: 5     Patient education and anticipatory guidance given Patient agrees with treatment plan Follow-up in 3 months for medication mangement or sooner as needed if symptoms worsen or fail to improve  Levonne Hubertharley E. Jaryah Aracena PA-C

## 2017-06-12 NOTE — Patient Instructions (Addendum)
For constipation: - take Linzess on an empty stomach 30 minutes before your first meal of the day - high fiber diet or fiber supplement like Psyllium - if Linzess is too expensive, then you will need to use generic Miralax (polyethylene glycol)  For pain: - increased Gabapentin to 300 mg three times daily (still 1 capsule)  - re-start Meloxicam - you need to take Omeprazole with your Meloxicam daily to prevent a bleeding ulcer  For anxiety/mood: - I have referred you downstairs - continue your daily medications for now - I will contact your psychiatrist office  For your blood pressure: - Goal <130/80 - start Lisinopril 10 mg every morning - baby aspirin 81 mg daily to help prevent heart attack/stroke - monitor and log blood pressures at home - check around the same time each day in a relaxed setting - Limit salt to <2000 mg/day - Follow DASH eating plan - avoid tobacco products - weight loss: 7% of current body weight - follow-up every 6 months for your blood pressure

## 2017-06-22 ENCOUNTER — Encounter: Payer: Self-pay | Admitting: Physician Assistant

## 2017-06-22 DIAGNOSIS — Z791 Long term (current) use of non-steroidal anti-inflammatories (NSAID): Secondary | ICD-10-CM | POA: Insufficient documentation

## 2017-06-22 DIAGNOSIS — I10 Essential (primary) hypertension: Secondary | ICD-10-CM

## 2017-06-22 DIAGNOSIS — K5909 Other constipation: Secondary | ICD-10-CM | POA: Insufficient documentation

## 2017-06-22 HISTORY — DX: Essential (primary) hypertension: I10

## 2017-07-02 ENCOUNTER — Encounter (HOSPITAL_COMMUNITY): Payer: Self-pay | Admitting: Psychiatry

## 2017-07-02 ENCOUNTER — Ambulatory Visit (INDEPENDENT_AMBULATORY_CARE_PROVIDER_SITE_OTHER): Payer: Medicare Other | Admitting: Psychiatry

## 2017-07-02 ENCOUNTER — Other Ambulatory Visit (HOSPITAL_COMMUNITY): Payer: Self-pay | Admitting: Psychiatry

## 2017-07-02 VITALS — BP 128/84 | HR 76 | Resp 14 | Ht 70.0 in | Wt 168.0 lb

## 2017-07-02 DIAGNOSIS — R450 Nervousness: Secondary | ICD-10-CM | POA: Diagnosis not present

## 2017-07-02 DIAGNOSIS — F251 Schizoaffective disorder, depressive type: Secondary | ICD-10-CM

## 2017-07-02 DIAGNOSIS — F17210 Nicotine dependence, cigarettes, uncomplicated: Secondary | ICD-10-CM

## 2017-07-02 DIAGNOSIS — Z736 Limitation of activities due to disability: Secondary | ICD-10-CM

## 2017-07-02 DIAGNOSIS — F5102 Adjustment insomnia: Secondary | ICD-10-CM | POA: Diagnosis not present

## 2017-07-02 DIAGNOSIS — F1290 Cannabis use, unspecified, uncomplicated: Secondary | ICD-10-CM

## 2017-07-02 DIAGNOSIS — F411 Generalized anxiety disorder: Secondary | ICD-10-CM | POA: Diagnosis not present

## 2017-07-02 DIAGNOSIS — Z818 Family history of other mental and behavioral disorders: Secondary | ICD-10-CM

## 2017-07-02 MED ORDER — DULOXETINE HCL 60 MG PO CPEP: 60.0000 mg | ORAL_CAPSULE | Freq: Every day | ORAL | 1 refills | Status: DC

## 2017-07-02 MED ORDER — VALPROIC ACID 250 MG PO CAPS: 250.0000 mg | ORAL_CAPSULE | ORAL | 0 refills | Status: DC

## 2017-07-02 MED ORDER — TRAZODONE HCL 50 MG PO TABS: 50.0000 mg | ORAL_TABLET | Freq: Every day | ORAL | 0 refills | Status: DC

## 2017-07-02 MED ORDER — OLANZAPINE 20 MG PO TABS: 20.0000 mg | ORAL_TABLET | Freq: Every day | ORAL | 0 refills | Status: DC
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
## 2017-07-29 ENCOUNTER — Ambulatory Visit (HOSPITAL_COMMUNITY): Payer: Self-pay | Admitting: Psychiatry

## 2017-08-26 ENCOUNTER — Other Ambulatory Visit: Payer: Self-pay

## 2017-08-26 ENCOUNTER — Encounter: Payer: Self-pay | Admitting: Emergency Medicine

## 2017-08-26 ENCOUNTER — Emergency Department
Admission: EM | Admit: 2017-08-26 | Discharge: 2017-08-26 | Disposition: A | Discharge status: Home / Self Care | Payer: Medicare Other | Attending: Emergency Medicine | Admitting: Emergency Medicine

## 2017-08-26 DIAGNOSIS — N341 Nonspecific urethritis: Secondary | ICD-10-CM | POA: Diagnosis not present

## 2017-08-26 DIAGNOSIS — I10 Essential (primary) hypertension: Secondary | ICD-10-CM | POA: Insufficient documentation

## 2017-08-26 DIAGNOSIS — R300 Dysuria: Secondary | ICD-10-CM | POA: Diagnosis present

## 2017-08-26 DIAGNOSIS — F17210 Nicotine dependence, cigarettes, uncomplicated: Secondary | ICD-10-CM | POA: Diagnosis not present

## 2017-08-26 DIAGNOSIS — Z79899 Other long term (current) drug therapy: Secondary | ICD-10-CM | POA: Diagnosis not present

## 2017-08-26 LAB — URINALYSIS, COMPLETE (UACMP) WITH MICROSCOPIC
BILIRUBIN URINE: NEGATIVE
Bacteria, UA: NONE SEEN
GLUCOSE, UA: NEGATIVE mg/dL
HGB URINE DIPSTICK: NEGATIVE
KETONES UR: 20 mg/dL — AB
Leukocytes, UA: NEGATIVE
NITRITE: NEGATIVE
PROTEIN: NEGATIVE mg/dL
Specific Gravity, Urine: 1.019 (ref 1.005–1.030)
pH: 5 (ref 5.0–8.0)

## 2017-08-26 LAB — CHLAMYDIA/NGC RT PCR (ARMC ONLY)
Chlamydia Tr: NOT DETECTED
N GONORRHOEAE: NOT DETECTED

## 2017-08-26 MED ORDER — AZITHROMYCIN 500 MG PO TABS
1000.0000 mg | ORAL_TABLET | Freq: Once | ORAL | Status: AC
  Administered 2017-08-26: 1000 mg via ORAL
  Filled 2017-08-26: qty 2

## 2017-08-26 NOTE — Discharge Instructions (Addendum)
Your labs are negative at this time. You are being treated with a single dose of antibiotic. Continue to monitor your symptoms. Follow-up with your provider for ongoing symptoms.

## 2017-08-26 NOTE — ED Triage Notes (Signed)
States he developed some burning with urination for a few days  Denies any penile discharge

## 2017-08-26 NOTE — ED Notes (Signed)
First Nurse Note:  "Stinging" on urination X 5 days.  Denies any discharge.
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
## 2017-08-28 ENCOUNTER — Telehealth: Payer: Self-pay | Admitting: Physician Assistant

## 2017-08-28 ENCOUNTER — Other Ambulatory Visit: Payer: Self-pay | Admitting: Physician Assistant

## 2017-08-28 NOTE — Telephone Encounter (Signed)
I went on the website and it seems the patient will have to try Bentyl. I do not see where the patient has tried this medication. Please advise.

## 2017-08-28 NOTE — Telephone Encounter (Signed)
He needs a medication for constipation. Bentyl is for patient's with IBS-diarrhea. This will not help him. Do they have anything on formulary for chronic constipation?

## 2017-08-28 NOTE — Telephone Encounter (Signed)
The only other option is Lactlose on the list for this year. Will this work? There are no other alternatives for this insurance. Please advise.

## 2017-09-03 NOTE — Telephone Encounter (Signed)
No, that won't work. He is going to have to use OTC Miralax

## 2017-09-03 NOTE — Telephone Encounter (Signed)
Patient is agreeable to try the Miralax at this time since insurance will not cover the current medication for constipation.

## 2017-09-11 ENCOUNTER — Ambulatory Visit: Payer: Medicare Other | Admitting: Physician Assistant

## 2017-09-30 ENCOUNTER — Ambulatory Visit: Payer: Self-pay | Admitting: Nurse Practitioner

## 2017-10-23 ENCOUNTER — Other Ambulatory Visit: Payer: Self-pay | Admitting: Physician Assistant

## 2017-10-23 DIAGNOSIS — I10 Essential (primary) hypertension: Secondary | ICD-10-CM

## 2017-11-03 ENCOUNTER — Ambulatory Visit (INDEPENDENT_AMBULATORY_CARE_PROVIDER_SITE_OTHER): Payer: Self-pay | Admitting: Nurse Practitioner

## 2017-11-03 ENCOUNTER — Encounter: Payer: Self-pay | Admitting: Nurse Practitioner

## 2017-11-03 ENCOUNTER — Other Ambulatory Visit: Payer: Self-pay

## 2017-11-03 VITALS — BP 121/68 | HR 82 | Temp 98.2°F | Ht 69.0 in | Wt 148.2 lb

## 2017-11-03 DIAGNOSIS — Z7689 Persons encountering health services in other specified circumstances: Secondary | ICD-10-CM

## 2017-11-03 NOTE — Progress Notes (Signed)
No charge visit:  After review of medical problems and medications with patient, it was determined mutually that this would not be a patient-provider relationship that should be continued.  Patient was unwilling to go to psychiatry for medications for his mental health.  He wanted to continue his alprazolam with claim that he had been taking alprazolam consistently since 2005.  Review of PMP aware showed fill on in May 2019.  Prior fill occurred November 2017.  Last psychiatrist did not have him taking alprazolam.  Patient has respectfully excused himself from the clinic.

## 2017-11-21 ENCOUNTER — Other Ambulatory Visit: Payer: Self-pay

## 2017-11-21 ENCOUNTER — Emergency Department
Admission: EM | Admit: 2017-11-21 | Discharge: 2017-11-21 | Disposition: A | Discharge status: Home / Self Care | Payer: Medicare Other | Attending: Emergency Medicine | Admitting: Emergency Medicine

## 2017-11-21 ENCOUNTER — Emergency Department: Payer: Medicare Other

## 2017-11-21 DIAGNOSIS — I10 Essential (primary) hypertension: Secondary | ICD-10-CM | POA: Insufficient documentation

## 2017-11-21 DIAGNOSIS — R0789 Other chest pain: Secondary | ICD-10-CM | POA: Diagnosis not present

## 2017-11-21 DIAGNOSIS — F17210 Nicotine dependence, cigarettes, uncomplicated: Secondary | ICD-10-CM | POA: Insufficient documentation

## 2017-11-21 DIAGNOSIS — Z7982 Long term (current) use of aspirin: Secondary | ICD-10-CM | POA: Diagnosis not present

## 2017-11-21 DIAGNOSIS — Z79899 Other long term (current) drug therapy: Secondary | ICD-10-CM | POA: Insufficient documentation

## 2017-11-21 LAB — COMPREHENSIVE METABOLIC PANEL
ALBUMIN: 4 g/dL (ref 3.5–5.0)
ALT: 9 U/L (ref 0–44)
ANION GAP: 7 (ref 5–15)
AST: 26 U/L (ref 15–41)
Alkaline Phosphatase: 48 U/L (ref 38–126)
BUN: 7 mg/dL (ref 6–20)
CO2: 27 mmol/L (ref 22–32)
Calcium: 8.9 mg/dL (ref 8.9–10.3)
Chloride: 101 mmol/L (ref 98–111)
Creatinine, Ser: 0.89 mg/dL (ref 0.61–1.24)
GLUCOSE: 137 mg/dL — AB (ref 70–99)
POTASSIUM: 3.7 mmol/L (ref 3.5–5.1)
Sodium: 135 mmol/L (ref 135–145)
TOTAL PROTEIN: 6.7 g/dL (ref 6.5–8.1)
Total Bilirubin: 0.7 mg/dL (ref 0.3–1.2)

## 2017-11-21 LAB — CBC
HEMATOCRIT: 39.8 % — AB (ref 40.0–52.0)
HEMOGLOBIN: 13.9 g/dL (ref 13.0–18.0)
MCH: 31.2 pg (ref 26.0–34.0)
MCHC: 35 g/dL (ref 32.0–36.0)
MCV: 89.2 fL (ref 80.0–100.0)
Platelets: 374 10*3/uL (ref 150–440)
RBC: 4.46 MIL/uL (ref 4.40–5.90)
RDW: 13.5 % (ref 11.5–14.5)
WBC: 10.6 10*3/uL (ref 3.8–10.6)

## 2017-11-21 LAB — LIPASE, BLOOD: Lipase: 23 U/L (ref 11–51)

## 2017-11-21 MED ORDER — TRAMADOL HCL 50 MG PO TABS: 50.0000 mg | ORAL_TABLET | Freq: Four times a day (QID) | ORAL | 0 refills | Status: DC | PRN

## 2017-11-21 MED ORDER — ACETAMINOPHEN 325 MG PO TABS
650.0000 mg | ORAL_TABLET | Freq: Once | ORAL | Status: AC
  Administered 2017-11-21: 650 mg via ORAL
  Filled 2017-11-21: qty 2
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
## 2017-11-30 ENCOUNTER — Emergency Department
Admission: EM | Admit: 2017-11-30 | Discharge: 2017-11-30 | Disposition: A | Discharge status: Other Acute Inpatient Hospital | Payer: Medicare Other | Attending: Emergency Medicine | Admitting: Emergency Medicine

## 2017-11-30 ENCOUNTER — Inpatient Hospital Stay
Admission: AD | Admit: 2017-11-30 | Discharge: 2017-12-05 | DRG: 885 | Disposition: A | Discharge status: Home / Self Care | Payer: Medicare Other | Attending: Psychiatry | Admitting: Psychiatry

## 2017-11-30 DIAGNOSIS — Z7982 Long term (current) use of aspirin: Secondary | ICD-10-CM | POA: Diagnosis not present

## 2017-11-30 DIAGNOSIS — F17210 Nicotine dependence, cigarettes, uncomplicated: Secondary | ICD-10-CM | POA: Diagnosis present

## 2017-11-30 DIAGNOSIS — F250 Schizoaffective disorder, bipolar type: Principal | ICD-10-CM | POA: Diagnosis present

## 2017-11-30 DIAGNOSIS — Z79899 Other long term (current) drug therapy: Secondary | ICD-10-CM

## 2017-11-30 DIAGNOSIS — Z833 Family history of diabetes mellitus: Secondary | ICD-10-CM

## 2017-11-30 DIAGNOSIS — Z8249 Family history of ischemic heart disease and other diseases of the circulatory system: Secondary | ICD-10-CM | POA: Diagnosis not present

## 2017-11-30 DIAGNOSIS — I10 Essential (primary) hypertension: Secondary | ICD-10-CM | POA: Diagnosis present

## 2017-11-30 DIAGNOSIS — F259 Schizoaffective disorder, unspecified: Secondary | ICD-10-CM | POA: Insufficient documentation

## 2017-11-30 DIAGNOSIS — M5136 Other intervertebral disc degeneration, lumbar region: Secondary | ICD-10-CM

## 2017-11-30 LAB — CBC
HCT: 42.8 % (ref 40.0–52.0)
HEMOGLOBIN: 14.8 g/dL (ref 13.0–18.0)
MCH: 31.5 pg (ref 26.0–34.0)
MCHC: 34.6 g/dL (ref 32.0–36.0)
MCV: 90.9 fL (ref 80.0–100.0)
Platelets: 344 10*3/uL (ref 150–440)
RBC: 4.7 MIL/uL (ref 4.40–5.90)
RDW: 14.3 % (ref 11.5–14.5)
WBC: 8 10*3/uL (ref 3.8–10.6)

## 2017-11-30 LAB — BASIC METABOLIC PANEL
Anion gap: 13 (ref 5–15)
BUN: 5 mg/dL — AB (ref 6–20)
CALCIUM: 9.4 mg/dL (ref 8.9–10.3)
CHLORIDE: 98 mmol/L (ref 98–111)
CO2: 24 mmol/L (ref 22–32)
CREATININE: 0.9 mg/dL (ref 0.61–1.24)
GFR calc Af Amer: >60 mL/min (ref >60)
GFR calc non Af Amer: >60 mL/min (ref >60)
Glucose, Bld: 126 mg/dL — ABNORMAL HIGH (ref 70–99)
Potassium: 3.8 mmol/L (ref 3.5–5.1)
Sodium: 135 mmol/L (ref 135–145)

## 2017-11-30 LAB — ETHANOL: Alcohol, Ethyl (B): <10 mg/dL (ref <10)

## 2017-11-30 LAB — VALPROIC ACID LEVEL: Valproic Acid Lvl: 40 ug/mL — ABNORMAL LOW (ref 50.0–100.0)

## 2017-11-30 LAB — SALICYLATE LEVEL: Salicylate Lvl: <7 mg/dL (ref 2.8–30.0)

## 2017-11-30 LAB — ACETAMINOPHEN LEVEL

## 2017-11-30 MED ORDER — ACETAMINOPHEN 325 MG PO TABS: 650.0000 mg | ORAL_TABLET | Freq: Four times a day (QID) | ORAL | Status: DC | PRN

## 2017-11-30 MED ORDER — DULOXETINE HCL 30 MG PO CPEP
60.0000 mg | ORAL_CAPSULE | Freq: Every day | ORAL | Status: DC
  Administered 2017-12-01: 60 mg via ORAL
  Filled 2017-11-30: qty 2

## 2017-11-30 MED ORDER — ASPIRIN EC 81 MG PO TBEC: 81.0000 mg | DELAYED_RELEASE_TABLET | Freq: Every day | ORAL | Status: DC

## 2017-11-30 MED ORDER — LINACLOTIDE 145 MCG PO CAPS
145.0000 ug | ORAL_CAPSULE | Freq: Every day | ORAL | Status: DC
  Administered 2017-12-01 – 2017-12-05 (×5): 145 ug via ORAL
  Filled 2017-11-30 (×5): qty 1

## 2017-11-30 MED ORDER — DIPHENHYDRAMINE HCL 50 MG/ML IJ SOLN
INTRAMUSCULAR | Status: AC
  Administered 2017-11-30: 50 mg
  Filled 2017-11-30: qty 1

## 2017-11-30 MED ORDER — DIPHENHYDRAMINE HCL 25 MG PO CAPS: 50.0000 mg | ORAL_CAPSULE | Freq: Every day | ORAL | Status: DC

## 2017-11-30 MED ORDER — OLANZAPINE 5 MG PO TABS
5.0000 mg | ORAL_TABLET | Freq: Two times a day (BID) | ORAL | Status: DC
  Administered 2017-11-30: 5 mg via ORAL
  Filled 2017-11-30: qty 1

## 2017-11-30 MED ORDER — OLANZAPINE 10 MG PO TABS
10.0000 mg | ORAL_TABLET | Freq: Two times a day (BID) | ORAL | Status: DC
  Administered 2017-12-01 – 2017-12-02 (×3): 10 mg via ORAL
  Filled 2017-11-30 (×3): qty 1

## 2017-11-30 MED ORDER — DIPHENHYDRAMINE HCL 50 MG/ML IJ SOLN: 50.0000 mg | Freq: Four times a day (QID) | INTRAMUSCULAR | Status: DC | PRN

## 2017-11-30 MED ORDER — HALOPERIDOL 5 MG PO TABS: 10.0000 mg | ORAL_TABLET | Freq: Four times a day (QID) | ORAL | Status: DC | PRN

## 2017-11-30 MED ORDER — HALOPERIDOL LACTATE 5 MG/ML IJ SOLN: 10.0000 mg | Freq: Four times a day (QID) | INTRAMUSCULAR | Status: DC | PRN

## 2017-11-30 MED ORDER — HALOPERIDOL LACTATE 5 MG/ML IJ SOLN
INTRAMUSCULAR | Status: AC
  Filled 2017-11-30: qty 1

## 2017-11-30 MED ORDER — LORAZEPAM 2 MG/ML IJ SOLN
2.0000 mg | INTRAMUSCULAR | Status: AC
  Administered 2017-11-30: 2 mg via INTRAMUSCULAR

## 2017-11-30 MED ORDER — OLANZAPINE 5 MG PO TABS: 5.0000 mg | ORAL_TABLET | Freq: Two times a day (BID) | ORAL | Status: DC

## 2017-11-30 MED ORDER — LORAZEPAM 2 MG/ML IJ SOLN: 2.0000 mg | Freq: Four times a day (QID) | INTRAMUSCULAR | Status: DC | PRN

## 2017-11-30 MED ORDER — MAGNESIUM HYDROXIDE 400 MG/5ML PO SUSP: 30.0000 mL | Freq: Every day | ORAL | Status: DC | PRN

## 2017-11-30 MED ORDER — TRAZODONE HCL 50 MG PO TABS
50.0000 mg | ORAL_TABLET | Freq: Every evening | ORAL | Status: DC | PRN
  Administered 2017-12-01 – 2017-12-02 (×2): 50 mg via ORAL
  Filled 2017-11-30 (×2): qty 1

## 2017-11-30 MED ORDER — HYDROXYZINE HCL 50 MG PO TABS: 50.0000 mg | ORAL_TABLET | Freq: Three times a day (TID) | ORAL | Status: DC | PRN

## 2017-11-30 MED ORDER — HALOPERIDOL LACTATE 5 MG/ML IJ SOLN
5.0000 mg | INTRAMUSCULAR | Status: AC
  Administered 2017-11-30: 5 mg via INTRAMUSCULAR

## 2017-11-30 MED ORDER — LORAZEPAM 2 MG/ML IJ SOLN
INTRAMUSCULAR | Status: AC
  Filled 2017-11-30: qty 1

## 2017-11-30 MED ORDER — TRAZODONE HCL 50 MG PO TABS: 50.0000 mg | ORAL_TABLET | Freq: Every day | ORAL | Status: DC

## 2017-11-30 MED ORDER — DIPHENHYDRAMINE HCL 50 MG/ML IJ SOLN
50.0000 mg | INTRAMUSCULAR | Status: AC
  Administered 2017-11-30: 50 mg via INTRAMUSCULAR

## 2017-11-30 MED ORDER — DIPHENHYDRAMINE HCL 25 MG PO CAPS: 50.0000 mg | ORAL_CAPSULE | Freq: Four times a day (QID) | ORAL | Status: DC | PRN

## 2017-11-30 MED ORDER — LORAZEPAM 2 MG PO TABS: 2.0000 mg | ORAL_TABLET | Freq: Four times a day (QID) | ORAL | Status: DC | PRN

## 2017-11-30 MED ORDER — PANTOPRAZOLE SODIUM 40 MG PO TBEC
40.0000 mg | DELAYED_RELEASE_TABLET | Freq: Every day | ORAL | Status: DC
  Administered 2017-12-01 – 2017-12-05 (×5): 40 mg via ORAL
  Filled 2017-11-30 (×5): qty 1

## 2017-11-30 MED ORDER — LISINOPRIL 20 MG PO TABS
10.0000 mg | ORAL_TABLET | Freq: Every day | ORAL | Status: DC
  Administered 2017-12-01 – 2017-12-05 (×5): 10 mg via ORAL
  Filled 2017-11-30 (×5): qty 1

## 2017-11-30 MED ORDER — DIVALPROEX SODIUM 500 MG PO DR TAB: 500.0000 mg | DELAYED_RELEASE_TABLET | Freq: Two times a day (BID) | ORAL | Status: DC

## 2017-11-30 MED ORDER — GABAPENTIN 300 MG PO CAPS
300.0000 mg | ORAL_CAPSULE | Freq: Three times a day (TID) | ORAL | Status: DC
  Administered 2017-12-01 (×2): 300 mg via ORAL
  Filled 2017-11-30 (×2): qty 1

## 2017-11-30 MED ORDER — DIVALPROEX SODIUM 500 MG PO DR TAB
500.0000 mg | DELAYED_RELEASE_TABLET | Freq: Three times a day (TID) | ORAL | Status: DC
  Filled 2017-11-30: qty 1
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
## 2017-11-30 NOTE — ED Notes (Signed)
Patient transferred from ER [facility] to [facility] Rm #, patient is very agitated and cant believe he is here due to "one pill". Patient is very entitled and wants to know why he was moved to this small area, and his Medicare is paying to much out for him to be in this tiny area. Patient says he is getting a Clinical research associate and getting himself out tomorrow.

## 2017-11-30 NOTE — ED Notes (Signed)
Report to include situation, background, assessment and recommendations from Jadeka RN. Patient sleeping, respirations regular and unlabored. Q15 minute rounds and security camera observation to continue.   

## 2017-11-30 NOTE — ED Notes (Signed)
Hourly rounding reveals patient in sleeping room. No complaints, stable, in no acute distress. Q15 minute rounds and monitoring via Security Cameras to continue. 

## 2017-11-30 NOTE — ED Notes (Signed)
Patient alert and oriented, warm and dry, in no acute distress. Patient denies SI, HI, AVH and pain. Patient made aware of Q15 minute rounds and security cameras for their safety. Patient instructed to come to me with needs or concerns. 

## 2017-11-30 NOTE — ED Notes (Signed)
Report called to [facility] RN patient moved to [facility] Patient aware of move and agreed has no questions at this time.

## 2017-11-30 NOTE — ED Notes (Signed)
Hourly rounding reveals patient in room. No complaints, stable, in no acute distress. Q15 minute rounds and monitoring via Security Cameras to continue. 

## 2017-11-30 NOTE — ED Notes (Signed)
Moved patient to [facility], patient pending placement

## 2017-11-30 NOTE — ED Triage Notes (Signed)
Pt belongings are as followed brown tennis shoes with white socks, black basketball shorts, with blue Tarheel tee shirt. Pt has black key fob with  14 keys on ring. Pt has a black leather wallet that has a buck label on the inside fold. Pt has [employer] debit card and drivers licsence,
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
## 2017-11-30 NOTE — ED Notes (Signed)
Soc called for consult

## 2017-11-30 NOTE — ED Notes (Signed)
Hourly rounding reveals patient sleeping in room. No complaints, stable, in no acute distress. Q15 minute rounds and monitoring via Security Cameras to continue. 

## 2017-11-30 NOTE — ED Triage Notes (Signed)
Pt presents today with [employer] and IVC paperwork. Pt has a hx of schizophrenic

## 2017-12-01 ENCOUNTER — Other Ambulatory Visit: Payer: Self-pay

## 2017-12-01 DIAGNOSIS — F250 Schizoaffective disorder, bipolar type: Principal | ICD-10-CM

## 2017-12-01 LAB — TSH: TSH: 4.024 u[IU]/mL (ref 0.350–4.500)

## 2017-12-01 LAB — LIPID PANEL
CHOL/HDL RATIO: 3.1 ratio
CHOLESTEROL: 122 mg/dL (ref 0–200)
HDL: 39 mg/dL — AB (ref >40)
LDL Cholesterol: 68 mg/dL (ref 0–99)
TRIGLYCERIDES: 74 mg/dL (ref <150)
VLDL: 15 mg/dL (ref 0–40)

## 2017-12-01 MED ORDER — LORAZEPAM 1 MG PO TABS
1.0000 mg | ORAL_TABLET | Freq: Four times a day (QID) | ORAL | Status: DC | PRN
  Administered 2017-12-03: 1 mg via ORAL
  Filled 2017-12-01: qty 1

## 2017-12-01 MED ORDER — DIVALPROEX SODIUM 500 MG PO DR TAB: 500.0000 mg | DELAYED_RELEASE_TABLET | Freq: Two times a day (BID) | ORAL | Status: DC

## 2017-12-01 MED ORDER — DIVALPROEX SODIUM ER 500 MG PO TB24: 1000.0000 mg | ORAL_TABLET | Freq: Every day | ORAL | Status: DC

## 2017-12-01 MED ORDER — GABAPENTIN 300 MG PO CAPS: 900.0000 mg | ORAL_CAPSULE | Freq: Three times a day (TID) | ORAL | Status: DC

## 2017-12-01 MED ORDER — HALOPERIDOL LACTATE 5 MG/ML IJ SOLN: 5.0000 mg | Freq: Four times a day (QID) | INTRAMUSCULAR | Status: DC | PRN

## 2017-12-01 MED ORDER — GABAPENTIN 300 MG PO CAPS
600.0000 mg | ORAL_CAPSULE | Freq: Three times a day (TID) | ORAL | Status: DC
  Administered 2017-12-01 – 2017-12-03 (×5): 600 mg via ORAL
  Filled 2017-12-01 (×5): qty 2

## 2017-12-01 MED ORDER — HALOPERIDOL 5 MG PO TABS: 5.0000 mg | ORAL_TABLET | Freq: Four times a day (QID) | ORAL | Status: DC | PRN

## 2017-12-01 MED ORDER — DULOXETINE HCL 30 MG PO CPEP
30.0000 mg | ORAL_CAPSULE | Freq: Every day | ORAL | Status: DC
  Administered 2017-12-02 – 2017-12-05 (×4): 30 mg via ORAL
  Filled 2017-12-01 (×4): qty 1

## 2017-12-01 MED ORDER — TRAZODONE HCL 100 MG PO TABS
100.0000 mg | ORAL_TABLET | Freq: Every day | ORAL | Status: DC
  Administered 2017-12-01 – 2017-12-04 (×4): 100 mg via ORAL
  Filled 2017-12-01 (×4): qty 1

## 2017-12-01 MED ORDER — LORAZEPAM 2 MG/ML IJ SOLN: 1.0000 mg | Freq: Four times a day (QID) | INTRAMUSCULAR | Status: DC | PRN

## 2017-12-01 MED ORDER — DIVALPROEX SODIUM ER 500 MG PO TB24
1500.0000 mg | ORAL_TABLET | Freq: Every day | ORAL | Status: DC
  Administered 2017-12-02 – 2017-12-03 (×2): 1500 mg via ORAL
  Filled 2017-12-01 (×2): qty 3

## 2017-12-01 MED ORDER — DIVALPROEX SODIUM ER 500 MG PO TB24
1000.0000 mg | ORAL_TABLET | Freq: Once | ORAL | Status: AC
  Administered 2017-12-01: 1000 mg via ORAL
  Filled 2017-12-01: qty 2
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
## 2017-12-01 NOTE — Progress Notes (Signed)
Patient ID: Theophilus Bones., male   DOB: 03-Oct-1970, 47 y.o.   MRN: [id number] Per State regulations 482.30 this chart was reviewed for medical necessity with respect to the patient's admission/duration of stay.    Next review date: 12/04/17  Thurman Coyer, BSN, RN-BC  Case Manager

## 2017-12-01 NOTE — Tx Team (Addendum)
Initial Treatment Plan 12/01/2017 12:11 AM Darius Campos. SKA:[id number]    PATIENT STRESSORS: Marital or family conflict   PATIENT STRENGTHS: Physical Health Average or above average intelligence   PATIENT IDENTIFIED PROBLEMS: Psychosis 11/30/2017                     DISCHARGE CRITERIA:  Adequate post-discharge living arrangements Improved stabilization in mood, thinking, and/or behavior Motivation to continue treatment in a less acute level of care Verbal commitment to aftercare and medication compliance  PRELIMINARY DISCHARGE PLAN: Attend aftercare/continuing care group Outpatient therapy Return to previous living arrangement  PATIENT/FAMILY INVOLVEMENT: This treatment plan has been presented to and reviewed with the patient, Darius Siconolfi., and/or family member.  The patient and family have been given the opportunity to ask questions and make suggestions.  Darius Manila, RN 12/01/2017, 12:11 AM

## 2017-12-01 NOTE — Plan of Care (Signed)
Patient in bed resting with eyes closed upon this writers arrival to the unit. Patient did not get up for breakfast this morning but stated, "Maybe later." Breakfast tray left for patient to retrieve a little later. Patient later got up to go eat. Medications taken without prompting. Patient present in the milieu intermittently this shift, minimal interaction with peers noted. Milieu remains safe with q 15 minute safety checks.
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
## 2017-12-01 NOTE — Plan of Care (Addendum)
Patient found asleep in bed upon my arrival. Patient is neither visible nor social this evening. Patient is mostly isolative to his room throughout the evening. Mood and affect are subdued. Patient appears embarrassed regarding yesterday's behavior and is reticent to make eye contact. Patient got up when snacks were announced and asked for directions. Inquired as to whether he would like to take his medication prior to eating and the patient agreed. Compliant with HS medication, given extra 50mg  dose of Trazodone with positive results. Patient ate adequate snack but did not interact with peers or staff. Denies SI/HI/AVH. Minimally verbal. Q 15 minute checks maintained. Will continue to monitor throughout the shift. Patient slept 6.5 hours. No apparent distress. Will endorse care to oncoming shift.  Problem: Education: Goal: Emotional status will improve Outcome: Progressing Goal: Mental status will improve Outcome: Progressing Goal: Verbalization of understanding the information provided will improve Outcome: Progressing   Problem: Coping: Goal: Ability to demonstrate self-control will improve Outcome: Progressing   Problem: Education: Goal: Will be free of psychotic symptoms Outcome: Progressing   Problem: Health Behavior/Discharge Planning: Goal: Compliance with prescribed medication regimen will improve Outcome: Progressing   Problem: Safety: Goal: Ability to redirect hostility and anger into socially appropriate behaviors will improve Outcome: Progressing   Problem: Activity: Goal: Interest or engagement in activities will improve Outcome: Not Progressing Goal: Sleeping patterns will improve Outcome: Not Progressing

## 2017-12-01 NOTE — Plan of Care (Signed)
New Admit  Problem: Education: Goal: Knowledge of General Education information will improve Description Including pain rating scale, medication(s)/side effects and non-pharmacologic comfort measures Outcome: Not Progressing   Problem: Education: Goal: Knowledge of [place] [facility] Education information/materials will improve Outcome: Not Progressing Goal: Emotional status will improve Outcome: Not Progressing Goal: Mental status will improve Outcome: Not Progressing Goal: Verbalization of understanding the information provided will improve Outcome: Not Progressing   Problem: Activity: Goal: Interest or engagement in activities will improve Outcome: Not Progressing Goal: Sleeping patterns will improve Outcome: Not Progressing   Problem: Coping: Goal: Ability to verbalize frustrations and anger appropriately will improve Outcome: Not Progressing Goal: Ability to demonstrate self-control will improve Outcome: Not Progressing   Problem: Health Behavior/Discharge Planning: Goal: Identification of resources available to assist in meeting health care needs will improve Outcome: Not Progressing Goal: Compliance with treatment plan for underlying cause of condition will improve Outcome: Not Progressing   Problem: Physical Regulation: Goal: Ability to maintain clinical measurements within normal limits will improve Outcome: Not Progressing   Problem: Safety: Goal: Periods of time without injury will increase Outcome: Not Progressing   Problem: Activity: Goal: Will verbalize the importance of balancing activity with adequate rest periods Outcome: Not Progressing   Problem: Education: Goal: Will be free of psychotic symptoms Outcome: Not Progressing Goal: Knowledge of the prescribed therapeutic regimen will improve Outcome: Not Progressing   Problem: Coping: Goal: Coping ability will improve Outcome: Not Progressing Goal: Will verbalize feelings Outcome: Not  Progressing   Problem: Health Behavior/Discharge Planning: Goal: Compliance with prescribed medication regimen will improve Outcome: Not Progressing   Problem: Safety: Goal: Ability to redirect hostility and anger into socially appropriate behaviors will improve Outcome: Not Progressing Goal: Ability to remain free from injury will improve Outcome: Not Progressing

## 2017-12-01 NOTE — Progress Notes (Signed)
Recreation Therapy Notes  INPATIENT RECREATION THERAPY ASSESSMENT  Patient Details Name: Darius Campos. MRN: [id number] DOB: January 02, 1971 Today's Date: 12/01/2017       Information Obtained From: (Patient refused)  Able to Participate in Assessment/Interview:    Patient Presentation:    Reason for Admission (Per Patient):    Patient Stressors:    Coping Skills:      Leisure Interests (2+):     Frequency of Recreation/Participation:    Awareness of Community Resources:     [employer]:     Current Use:    If no, Barriers?:    Expressed Interest in [employer] Information:    [place] of Residence:     Patient Main Form of Transportation:    Patient Strengths:     Patient Identified Areas of Improvement:     Patient Goal for Hospitalization:     Current SI (including self-harm):     Current HI:     Current AVH:    Staff Intervention Plan:    Consent to Intern Participation:    Ercell Perlman 12/01/2017, 1:10 PM

## 2017-12-01 NOTE — [facility] Suicide Risk Assessment (Signed)
[facility] INPATIENT:  Family/Significant Other Suicide Prevention Education  Suicide Prevention Education:  Education Completed; Granville Lewis, Pts. Brother, [phone] has been identified by the patient as the family member/significant other with whom the patient will be residing, and identified as the person(s) who will aid the patient in the event of a mental health crisis (suicidal ideations/suicide attempt).  With written consent from the patient, the family member/significant other has been provided the following suicide prevention education, prior to the and/or following the discharge of the patient.  The suicide prevention education provided includes the following:  Suicide risk factors  Suicide prevention and interventions  National Suicide Hotline telephone number  [facility] assessment telephone number  [facility] Emergency Assistance 911  [facility] and/or Residential Mobile Crisis Unit telephone number  Request made of family/significant other to:  Remove weapons (e.g., guns, rifles, knives), all items previously/currently identified as safety concern.    Remove drugs/medications (over-the-counter, prescriptions, illicit drugs), all items previously/currently identified as a safety concern.  The family member/significant other verbalizes understanding of the suicide prevention education information provided.  The family member/significant other agrees to remove the items of safety concern listed above.   CSW spoke with the patiens brother about the precipitating factors that led to the patients admission. He reports that the patient has been off of his medications for 49 months trying to get his nepew to purchase a pistol for him. His brother reports that he has a 94 year old special needs daughter in the home which worries him. He reports that when the patient is discharged he can come back to the home and be served with evtion papers. He reports that the  patient more than likely cant go live with his children as no one in the family wants to be bothered with him. He reports concerns that the patient will not be compliant with his medications once discharged. He reports that there are no guns/weapons in the home. All questions were answered and all concerns were addressed.  Johny Shears 12/01/2017, 2:11 PM

## 2017-12-01 NOTE — Tx Team (Addendum)
Interdisciplinary Treatment and Diagnostic Plan Update  12/01/2017 Time of Session: 11am Darius Campos. MRN: [id number]  Principal Diagnosis: <principal problem not specified>  Secondary Diagnoses: Active Problems:   Schizoaffective disorder, bipolar type (HCC)   Schizoaffective disorder (HCC)   Current Medications:  Current Facility-Administered Medications  Medication Dose Route Frequency Provider Last Rate Last Dose  . acetaminophen (TYLENOL) tablet 650 mg  650 mg Oral Q6H PRN He, Jun, MD      . diphenhydrAMINE (BENADRYL) capsule 50 mg  50 mg Oral QHS He, Jun, MD      . diphenhydrAMINE (BENADRYL) capsule 50 mg  50 mg Oral Q6H PRN Pucilowska, Jolanta B, MD       Or  . diphenhydrAMINE (BENADRYL) injection 50 mg  50 mg Intramuscular Q6H PRN Pucilowska, Jolanta B, MD      . divalproex (DEPAKOTE) DR tablet 500 mg  500 mg Oral Q12H McNew, Holly R, MD      . DULoxetine (CYMBALTA) DR capsule 60 mg  60 mg Oral Daily He, Jun, MD   60 mg at 12/01/17 0823  . gabapentin (NEURONTIN) capsule 300 mg  300 mg Oral TID He, Jun, MD   300 mg at 12/01/17 1914  . haloperidol (HALDOL) tablet 5 mg  5 mg Oral Q6H PRN McNew, Ileene Hutchinson, MD       Or  . haloperidol lactate (HALDOL) injection 5 mg  5 mg Intramuscular Q6H PRN McNew, Ileene Hutchinson, MD      . hydrOXYzine (ATARAX/VISTARIL) tablet 50 mg  50 mg Oral TID PRN He, Jun, MD      . linaclotide Karlene Einstein) capsule 145 mcg  145 mcg Oral QAC breakfast He, Jun, MD   145 mcg at 12/01/17 0824  . lisinopril (PRINIVIL,ZESTRIL) tablet 10 mg  10 mg Oral Daily He, Jun, MD   10 mg at 12/01/17 7829  . LORazepam (ATIVAN) tablet 1 mg  1 mg Oral Q6H PRN McNew, Ileene Hutchinson, MD       Or  . LORazepam (ATIVAN) injection 1 mg  1 mg Intramuscular Q6H PRN McNew, Ileene Hutchinson, MD      . magnesium hydroxide (MILK OF MAGNESIA) suspension 30 mL  30 mL Oral Daily PRN He, Jun, MD      . OLANZapine (ZYPREXA) tablet 10 mg  10 mg Oral BID Pucilowska, Jolanta B, MD   10 mg at 12/01/17 0824  .  pantoprazole (PROTONIX) EC tablet 40 mg  40 mg Oral Daily He, Jun, MD   40 mg at 12/01/17 0824  . traZODone (DESYREL) tablet 50 mg  50 mg Oral QHS PRN He, Jun, MD      . traZODone (DESYREL) tablet 50 mg  50 mg Oral QHS He, Jun, MD       PTA Medications: Medications Prior to Admission  Medication Sig Dispense Refill Last Dose  . ALPRAZolam (XANAX) 1 MG tablet Take by mouth.   Not Taking  . aspirin EC 81 MG tablet Take 81 mg by mouth daily.   Taking  . BANOPHEN 50 MG capsule Take 1 capsule by mouth at bedtime.  0 Not Taking  . divalproex (DEPAKOTE) 500 MG DR tablet Take 500 mg by mouth 2 (two) times daily.   Taking  . DULoxetine (CYMBALTA) 30 MG capsule Take 30 mg by mouth every morning.  0   . EPINEPHrine 0.3 mg/0.3 mL IJ SOAJ injection Inject into the muscle.   Taking  . esomeprazole (NEXIUM) 40 MG capsule Take 40 mg  by mouth daily at 12 noon.   Not Taking  . gabapentin (NEURONTIN) 300 MG capsule Take 1 capsule (300 mg total) by mouth 3 (three) times daily. (Patient not taking: Reported on 11/03/2017) 90 capsule 5 Not Taking  . lisinopril (PRINIVIL,ZESTRIL) 10 MG tablet Take 1 tablet (10 mg total) by mouth daily. Due for follow up visit 30 tablet 0 Taking  . misoprostol (CYTOTEC) 200 MCG tablet Take by mouth.   Not Taking  . OLANZapine (ZYPREXA) 20 MG tablet Take 1 tablet (20 mg total) by mouth at bedtime. (Patient not taking: Reported on 11/03/2017) 30 tablet 0 Not Taking  . traZODone (DESYREL) 50 MG tablet Take 1 tablet (50 mg total) by mouth at bedtime. 30 tablet 0 Taking  . valproic acid (DEPAKENE) 250 MG capsule Take 1 capsule (250 mg total) by mouth as directed. Take one during the day and 3 at night. (Patient not taking: Reported on 11/03/2017) 120 capsule 0 Not Taking    Patient Stressors: Marital or family conflict  Patient Strengths: Average or above average intelligence  Treatment Modalities: Medication Management, Group therapy, Case management,  1 to 1 session with clinician,  Psychoeducation, Recreational therapy.   Physician Treatment Plan for Primary Diagnosis: <principal problem not specified> Long Term Goal(s):     Short Term Goals:    Medication Management: Evaluate patient's response, side effects, and tolerance of medication regimen.  Therapeutic Interventions: 1 to 1 sessions, Unit Group sessions and Medication administration.  Evaluation of Outcomes: Progressing  Physician Treatment Plan for Secondary Diagnosis: Active Problems:   Schizoaffective disorder, bipolar type (HCC)   Schizoaffective disorder (HCC)  Long Term Goal(s):     Short Term Goals:       Medication Management: Evaluate patient's response, side effects, and tolerance of medication regimen.  Therapeutic Interventions: 1 to 1 sessions, Unit Group sessions and Medication administration.  Evaluation of Outcomes: Progressing   RN Treatment Plan for Primary Diagnosis: <principal problem not specified> Long Term Goal(s): Knowledge of disease and therapeutic regimen to maintain health will improve  Short Term Goals: Ability to demonstrate self-control, Ability to participate in decision making will improve, Ability to verbalize feelings will improve, Ability to identify and develop effective coping behaviors will improve and Compliance with prescribed medications will improve  Medication Management: RN will administer medications as ordered by provider, will assess and evaluate patient's response and provide education to patient for prescribed medication. RN will report any adverse and/or side effects to prescribing provider.  Therapeutic Interventions: 1 on 1 counseling sessions, Psychoeducation, Medication administration, Evaluate responses to treatment, Monitor vital signs and CBGs as ordered, Perform/monitor CIWA, COWS, AIMS and Fall Risk screenings as ordered, Perform wound care treatments as ordered.  Evaluation of Outcomes: Progressing   LCSW Treatment Plan for Primary  Diagnosis: <principal problem not specified> Long Term Goal(s): Safe transition to appropriate next level of care at discharge, Engage patient in therapeutic group addressing interpersonal concerns.  Short Term Goals: Engage patient in aftercare planning with referrals and resources, Increase social support, Facilitate patient progression through stages of change regarding substance use diagnoses and concerns, Identify triggers associated with mental health/substance abuse issues and Increase skills for wellness and recovery  Therapeutic Interventions: Assess for all discharge needs, 1 to 1 time with Social worker, Explore available resources and support systems, Assess for adequacy in community support network, Educate family and significant other(s) on suicide prevention, Complete Psychosocial Assessment, Interpersonal group therapy.  Evaluation of Outcomes: Progressing   Progress in Treatment:  Attending groups: No. Participating in groups: No. Taking medication as prescribed: Yes. Toleration medication: Yes. Family/Significant other contact made: No, will contact:  Family if patient allows Patient understands diagnosis: Yes. Discussing patient identified problems/goals with staff: Yes. Medical problems stabilized or resolved: Yes. Denies suicidal/homicidal ideation: Yes. Issues/concerns per patient self-inventory: No. Other:   New problem(s) identified: No, Describe:  None  New Short Term/Long Term Goal(s): "Rest, maybe a little talk, that's it."  Patient Goals:   "Rest, maybe a little talk, that's it."  Discharge Plan or Barriers: To be discharged to his sisters house and follow up with outpatient treatment.  Reason for Continuation of Hospitalization: Medication stabilization  Estimated Length of Stay: 3-5 days  Recreational Therapy: Patient Stressors: N/A Patient Goal: Patient will engage in groups with a calm and appropriate mood at least 2x within 5 recreation therapy  group sessions  Attendees: Patient: Darius Campos 12/01/2017 10:53 AM  Physician: Corinna Gab, MD 12/01/2017 10:53 AM  Nursing:  12/01/2017 10:53 AM  RN Care Manager: 12/01/2017 10:53 AM  Social Worker: Johny Shears, LCSWA 12/01/2017 10:53 AM  Recreational Therapist: Danella Deis. Dreama Saa, LRT 12/01/2017 10:53 AM  Other: Jake Shark, LCSW 12/01/2017 10:53 AM  Other:  12/01/2017 10:53 AM  Other: 12/01/2017 10:53 AM    Scribe for Treatment Team: Johny Shears, LCSW 12/01/2017 10:53 AM

## 2017-12-02 LAB — HEMOGLOBIN A1C
Hgb A1c MFr Bld: 5.5 % (ref 4.8–5.6)
Mean Plasma Glucose: 111.15 mg/dL

## 2017-12-02 MED ORDER — OLANZAPINE 10 MG PO TABS
20.0000 mg | ORAL_TABLET | Freq: Every day | ORAL | Status: DC
  Administered 2017-12-03 – 2017-12-04 (×2): 20 mg via ORAL
  Filled 2017-12-02 (×2): qty 2

## 2017-12-02 MED ORDER — OLANZAPINE 10 MG PO TABS
10.0000 mg | ORAL_TABLET | Freq: Once | ORAL | Status: AC
  Administered 2017-12-02: 10 mg via ORAL
  Filled 2017-12-02: qty 1

## 2017-12-02 NOTE — Plan of Care (Signed)
Patient is pleasant and cooperative on approach.Denies SI,HI and AVH.Appropriate with staff & peers.Compliant with medications.Attended groups.Appetite and energy level good.Support and encouragement given.

## 2017-12-02 NOTE — [facility] Group Notes (Signed)
[facility] Group Notes:  (Nursing/MHT/Case Management/Adjunct)  Date:  12/02/2017  Time:  9:38 PM  Type of Therapy:  Group Therapy  Participation Level:  Active  Participation Quality:  Appropriate  Affect:  Appropriate  Cognitive:  Appropriate  Insight:  Appropriate  Engagement in Group:  Engaged  Modes of Intervention:  Discussion  Summary of Progress/Problems: Rollyn needed to be redirected during group. Dorn began informing the group he had been investigated, which lead to him losing everything. Zoren stated he lost everything because of people in the hospital. Khalifa stated his goal was to accept others and begin trusting others.  Jinger Neighbors 12/02/2017, 9:38 PM

## 2017-12-02 NOTE — [facility] Group Notes (Signed)
12/02/2017 1PM  Type of Therapy/Topic:  Group Therapy:  Feelings about Diagnosis  Participation Level:  Active   Description of Group:   This group will allow patients to explore their thoughts and feelings about diagnoses they have received. Patients will be guided to explore their level of understanding and acceptance of these diagnoses. Facilitator will encourage patients to process their thoughts and feelings about the reactions of others to their diagnosis and will guide patients in identifying ways to discuss their diagnosis with significant others in their lives. This group will be process-oriented, with patients participating in exploration of their own experiences, giving and receiving support, and processing challenge from other group members.   Therapeutic Goals: 1. Patient will demonstrate understanding of diagnosis as evidenced by identifying two or more symptoms of the disorder 2. Patient will be able to express two feelings regarding the diagnosis 3. Patient will demonstrate their ability to communicate their needs through discussion and/or role play  Summary of Patient Progress: Actively and appropriately engaged in the group. Patient was able to provide support and validation to other group members.Patient practiced active listening when interacting with the facilitator and other group members. Patient spoke about being overwhelmed at times and withdrawing himself from others. He says "people judge you when you tell them about your diagnosis. I don't know if I should be going to a psychiatrist or a therapist. I cant afford both." CSW educated the patient on community resources that can assist him with paying for his medications. He mentions that if he can cover the cost, he would like to do both. Patient is still in the process of obtaining treatment goals.        Therapeutic Modalities:   Cognitive Behavioral Therapy Brief Therapy Feelings Identification    Johny Shears, LCSW 12/02/2017 2:05 PM

## 2017-12-02 NOTE — Progress Notes (Signed)
[facility] MD Progress Note  12/02/2017 2:30 PM Darius Campos.  MRN:  [id number] Subjective:  Per RN reports, he did appear embarrassed about his outbursts on admission. He has been medication compliant. Pt is much calmer today. HE is very pleasant and polite in interaction. He is much less paranoid and did not bring up any delusional thought content in interview today. He states that he is catching up on rest. He is perseverative on his brother turning things around and calling the police. He redirects much better. He talks about his photo books that he enjoys working on for his family. HE states that they are motivational books for people. He adamantly denies SI or HI, even towards his brother. HE denies owning or having access to guns. HE has been medication compliant and feels they are helpful in keeping him calm. He plans to return to his brother's to get his belongings and then stay with his daughter. He did attend group this afternoon.   Principal Problem: Schizoaffective disorder, bipolar type (HCC) Diagnosis:   Patient Active Problem List   Diagnosis Date Noted  . Schizoaffective disorder, bipolar type (HCC) [F25.0] 11/30/2017    Priority: High  . Schizoaffective disorder (HCC) [F25.9] 11/30/2017  . Chronic constipation [K59.09] 06/22/2017  . NSAID long-term use [Z79.1] 06/22/2017  . Hypertension goal BP (blood pressure) < 130/80 [I10] 06/22/2017  . Lumbar degenerative disc disease [M51.36] 04/15/2017  . Schizoaffective disorder, depressive type (HCC) [F25.1] 04/09/2017  . Encounter for medication monitoring [Z51.81] 04/09/2017  . Abdominal pain, chronic, left lower quadrant [R10.32, G89.29] 04/09/2017  . Hematochezia [K92.1] 04/09/2017  . Tobacco use disorder [F17.200] 04/09/2017  . 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 PSC (posterior subcapsular cataract), left [IMO0002] 07/13/2014  . IFG (impaired fasting glucose) [R73.01] 03/10/2013   Total Time spent with  patient: 20 minutes  Past Psychiatric History: See H&  Past Medical History:  Past Medical History:  Diagnosis Date  . Cataract cortical, senile, left 07/11/2014  . Constipation   . Hypertension goal BP (blood pressure) < 130/80 06/22/2017  . IFG (impaired fasting glucose) 03/10/2013  . Lumbar degenerative disc disease 04/15/2017  . Paranoid schizophrenia (HCC)   . Schizoaffective disorder, depressive type (HCC) 04/09/2017  . Tobacco use     Past Surgical History:  Procedure Laterality Date  . APPENDECTOMY     Family History:  Family History  Problem Relation Age of Onset  . Diabetes Mother   . Hypertension Father    Family Psychiatric  History: See H&P Social History:  Social History   Substance and Sexual Activity  Alcohol Use No  . Frequency: Never     Social History   Substance and Sexual Activity  Drug Use Yes  . Types: Marijuana    Social History   Socioeconomic History  . Marital status: Single    Spouse name: Not on file  . Number of children: Not on file  . Years of education: Not on file  . Highest education level: Not on file  Occupational History  . Not on file  Social Needs  . Financial resource strain: Patient refused  . Food insecurity:    Worry: Patient refused    Inability: Patient refused  . Transportation needs:    Medical: Patient refused    Non-medical: Patient refused  Tobacco Use  . Smoking status: Current Every Day Smoker    Packs/day: 1.50    Years: 30.00    Pack years: 45.00  Types: Cigarettes  . Smokeless tobacco: Never Used  Substance and Sexual Activity  . Alcohol use: No    Frequency: Never  . Drug use: Yes    Types: Marijuana  . Sexual activity: Yes    Birth control/protection: None  Lifestyle  . Physical activity:    Days per week: Patient refused    Minutes per session: Patient refused  . Stress: Not on file  Relationships  . Social connections:    Talks on phone: Patient refused    Gets together: Patient  refused    Attends religious service: Patient refused    Active member of club or organization: Patient refused    Attends meetings of clubs or organizations: Patient refused    Relationship status: Patient refused  Other Topics Concern  . Not on file  Social History Narrative  . Not on file   Additional Social History:    Pain Medications: Refused  Prescriptions: refused Over the Counter: refused History of alcohol / drug use?: No history of alcohol / drug abuse Longest period of sobriety (when/how long): refused Negative Consequences of Use: (refused) Withdrawal Symptoms: (refused)                    Sleep: Improving  Appetite:  Good  Current Medications: Current Facility-Administered Medications  Medication Dose Route Frequency Provider Last Rate Last Dose  . acetaminophen (TYLENOL) tablet 650 mg  650 mg Oral Q6H PRN He, Jun, MD      . diphenhydrAMINE (BENADRYL) capsule 50 mg  50 mg Oral Q6H PRN Pucilowska, Jolanta B, MD       Or  . diphenhydrAMINE (BENADRYL) injection 50 mg  50 mg Intramuscular Q6H PRN Pucilowska, Jolanta B, MD      . divalproex (DEPAKOTE ER) 24 hr tablet 1,500 mg  1,500 mg Oral QHS Oluwaseyi Raffel R, MD      . DULoxetine (CYMBALTA) DR capsule 30 mg  30 mg Oral Daily Calyssa Zobrist, Ileene Hutchinson, MD   30 mg at 12/02/17 0803  . gabapentin (NEURONTIN) capsule 600 mg  600 mg Oral TID Haskell Riling, MD   600 mg at 12/02/17 1156  . haloperidol (HALDOL) tablet 5 mg  5 mg Oral Q6H PRN Tavonte Seybold, Ileene Hutchinson, MD       Or  . haloperidol lactate (HALDOL) injection 5 mg  5 mg Intramuscular Q6H PRN Saraiya Kozma, Ileene Hutchinson, MD      . hydrOXYzine (ATARAX/VISTARIL) tablet 50 mg  50 mg Oral TID PRN He, Jun, MD      . linaclotide Karlene Einstein) capsule 145 mcg  145 mcg Oral QAC breakfast He, Jun, MD   145 mcg at 12/02/17 0804  . lisinopril (PRINIVIL,ZESTRIL) tablet 10 mg  10 mg Oral Daily He, Jun, MD   10 mg at 12/02/17 0803  . LORazepam (ATIVAN) tablet 1 mg  1 mg Oral Q6H PRN Latiesha Harada, Ileene Hutchinson, MD        Or  . LORazepam (ATIVAN) injection 1 mg  1 mg Intramuscular Q6H PRN Channah Godeaux, Ileene Hutchinson, MD      . magnesium hydroxide (MILK OF MAGNESIA) suspension 30 mL  30 mL Oral Daily PRN He, Jun, MD      . OLANZapine (ZYPREXA) tablet 10 mg  10 mg Oral Once Shaydon Lease, Ileene Hutchinson, MD      . Melene Muller ON 12/03/2017] OLANZapine (ZYPREXA) tablet 20 mg  20 mg Oral QHS Rustyn Conery, Ileene Hutchinson, MD      . pantoprazole (PROTONIX) EC tablet  40 mg  40 mg Oral Daily He, Jun, MD   40 mg at 12/02/17 0804  . traZODone (DESYREL) tablet 100 mg  100 mg Oral QHS Caidynce Muzyka, Ileene Hutchinson, MD   100 mg at 12/01/17 2102  . traZODone (DESYREL) tablet 50 mg  50 mg Oral QHS PRN He, Jun, MD   50 mg at 12/01/17 2102    Lab Results: No results found for this or any previous visit (from the past 48 hour(s)).  Blood Alcohol level:  Lab Results  Component Value Date   ETH <10 11/30/2017    Metabolic Disorder Labs: Lab Results  Component Value Date   HGBA1C 5.5 11/30/2017   MPG 111.15 11/30/2017   No results found for: PROLACTIN Lab Results  Component Value Date   CHOL 122 11/30/2017   TRIG 74 11/30/2017   HDL 39 (L) 11/30/2017   CHOLHDL 3.1 11/30/2017   VLDL 15 11/30/2017   LDLCALC 68 11/30/2017   LDLCALC 60 04/09/2017    Physical Findings: AIMS:  , ,  ,  ,    CIWA:    COWS:     Musculoskeletal: Strength & Muscle Tone: within normal limits Gait & Station: normal Patient leans: N/A  Psychiatric Specialty Exam: Physical Exam  Nursing note and vitals reviewed.   Review of Systems  All other systems reviewed and are negative.   Blood pressure 121/79, pulse 71, temperature 98.7 F (37.1 C), resp. rate 18, height 5\' 10"  (1.778 m), weight 67 kg, SpO2 97 %.Body mass index is 21.19 kg/m.  General Appearance: Casual, better groomed today  Eye Contact:  Good  Speech:  Clear and Coherent  Volume:  Normal  Mood:  Euthymic  Affect:  Appropriate  Thought Process:  Coherent and Goal Directed  Orientation:  Full (Time, Place, and Person)   Thought Content:  Logical  Suicidal Thoughts:  No  Homicidal Thoughts:  No  Memory:  Immediate;   Fair  Judgement:  Fair  Insight:  Fair  Psychomotor Activity:  Normal  Concentration:  Concentration: Fair  Recall:  [employer] of Knowledge:  Fair  Language:  Fair  Akathisia:  No      Assets:  Resilience  ADL's:  Intact  Cognition:  WNL  Sleep:  Number of Hours: 6.5     Treatment Plan Summary: 47 yo male admitted due to agitation and paranoia in the context of medication noncompliance. He is much calmer today and polite. HE is sleeping much better and much less agitated. Paranoia and delusions are much less prominent.   Plan:  Schizoaffective disorder -Continue Zyprexa 10 mg BID -Continue Depakote ER 1500 mg qhs -Continue lower dose of Cymbalta of 30 mg daily. Goal would be to taper off completely due to risk of causing mania  HTN -BP good today -Continue Lisinopril 10 mg daily  Benzo abuse -He is on gabapentin and Depakote to prevent any w/d seizures  Dispo -He will return to his brothers house on discharge to get his belongings.   Haskell Riling, MD 12/02/2017, 2:30 PM

## 2017-12-02 NOTE — Progress Notes (Signed)
Recreation Therapy Notes  Date: 12/02/2017  Time: 9:30 am   Location: Outside   Behavioral response: N/A   Intervention Topic: Problem Solving  Discussion/Intervention: Patient did not attend group.   Clinical Observations/Feedback:  Patient did not attend group.   Trevan Messman LRT/CTRS        Darius Campos 12/02/2017 11:07 AM 

## 2017-12-03 MED ORDER — GABAPENTIN 300 MG PO CAPS
300.0000 mg | ORAL_CAPSULE | Freq: Three times a day (TID) | ORAL | Status: DC
  Administered 2017-12-03 – 2017-12-05 (×5): 300 mg via ORAL
  Filled 2017-12-03 (×5): qty 1

## 2017-12-03 MED ORDER — MELOXICAM 7.5 MG PO TABS
15.0000 mg | ORAL_TABLET | Freq: Two times a day (BID) | ORAL | Status: DC | PRN
  Administered 2017-12-03 – 2017-12-04 (×2): 15 mg via ORAL
  Filled 2017-12-03 (×2): qty 2

## 2017-12-03 NOTE — Plan of Care (Addendum)
Patient found awake in bed upon my arrival. Patient is visible intermittently but not social this evening. Patient is polite and cooperative with assessment this evening. No sign of aggressive behavior. Affect is more animated and patient even smiled while speaking about his daughter and grand daughter. Denies SI/HI/AVH. Appearance improved. Denies pain. Reports eating and voiding adequately. Compliant with HS medications and staff direction. Improving in all areas. Given extra 50mg  Trazodone for sleep with positive results. Patient reports he would like to be given Ambien. Told patient I would inform provider. Q 15 minute checks maintained. Will continue to monitor throughout the shift. Patient slept 7.5 hours. No apparent distress. Will endorse care to oncoming shift.  Problem: Education: Goal: Knowledge of General Education information will improve Description Including pain rating scale, medication(s)/side effects and non-pharmacologic comfort measures Outcome: Progressing   Problem: Education: Goal: Emotional status will improve Outcome: Progressing Goal: Mental status will improve Outcome: Progressing Goal: Verbalization of understanding the information provided will improve Outcome: Progressing   Problem: Activity: Goal: Sleeping patterns will improve Outcome: Progressing   Problem: Coping: Goal: Ability to verbalize frustrations and anger appropriately will improve Outcome: Progressing Goal: Ability to demonstrate self-control will improve Outcome: Progressing   Problem: Education: Goal: Will be free of psychotic symptoms Outcome: Progressing Goal: Knowledge of the prescribed therapeutic regimen will improve Outcome: Progressing   Problem: Coping: Goal: Coping ability will improve Outcome: Progressing   Problem: Safety: Goal: Ability to redirect hostility and anger into socially appropriate behaviors will improve Outcome: Progressing

## 2017-12-03 NOTE — [facility] Group Notes (Signed)
LCSW Group Therapy Note  12/03/2017 1:00 pm  Type of Therapy/Topic:  Group Therapy:  Emotion Regulation  Participation Level:  Active   Description of Group:    The purpose of this group is to assist patients in learning to regulate negative emotions and experience positive emotions. Patients will be guided to discuss ways in which they have been vulnerable to their negative emotions. These vulnerabilities will be juxtaposed with experiences of positive emotions or situations, and patients will be challenged to use positive emotions to combat negative ones. Special emphasis will be placed on coping with negative emotions in conflict situations, and patients will process healthy conflict resolution skills.  Therapeutic Goals: 1. Patient will identify two positive emotions or experiences to reflect on in order to balance out negative emotions 2. Patient will label two or more emotions that they find the most difficult to experience 3. Patient will demonstrate positive conflict resolution skills through discussion and/or role plays  Summary of Patient Progress:  Enan actively participated in today's group discussion on emotion regulation.  Denzell shared that he struggles with feeling "used up" by his family and girlfriend and that for conflict resolution he has decided to just 'walk away from them for awhile".  Awan shared that he has felt "contentment" when he has emotion regulation and like others in the group, he has found coming to the hospital to be helpful.     Therapeutic Modalities:   Cognitive Behavioral Therapy Feelings Identification Dialectical Behavioral Therapy

## 2017-12-03 NOTE — Progress Notes (Signed)
Recreation Therapy Notes   Date: 12/03/2017  Time: 9:30 am  Location: Craft Room  Behavioral response: Appropriate    Intervention Topic: Team Work  Discussion/Intervention:  Group content on today was focused on teamwork. The group identified what teamwork is. Individuals described who is a part of their team. Patients expressed why they thought teamwork is important. The group stated reasons why they thought it was easier to work with a Comptroller team. Individuals discussed some positives and negatives of working with a team. Patients gave examples of past experiences they had while working with a team. The group participated in the intervention "What is That", where patients were given a chance to point out qualities they look for in a team mate and were able to work in teams with each other.  Clinical Observations/Feedback:  Patient came to group and defined team work as working with others.He stated that he looks for reliability in a team member. Individual explained that he can offer trust and honsety to a team. Patient was social with peers and staff while participating in an intervention. Tyaira Heward LRT/CTRS         Loucille Takach 12/03/2017 12:00 PM
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
## 2017-12-03 NOTE — Plan of Care (Signed)
Patient is alert and oriented X 4; denies SI and AHV. Patient denies having sleeping issues. Patient very pleasant; attending groups; logical and coherent. Patient eating meals well. No complaints wanting to discuss. Patient denies pain;rating 0/10. Patient demeanor is pleasant, quiet and calm on the unit. Patient is showing interest in activities and emotional status is approved; nurse will continue to monitor. Problem: Coping: Goal: Ability to verbalize frustrations and anger appropriately will improve Outcome: Progressing Goal: Ability to demonstrate self-control will improve Outcome: Progressing   Problem: Health Behavior/Discharge Planning: Goal: Identification of resources available to assist in meeting health care needs will improve Outcome: Progressing Goal: Compliance with treatment plan for underlying cause of condition will improve Outcome: Progressing   Problem: Physical Regulation: Goal: Ability to maintain clinical measurements within normal limits will improve Outcome: Progressing   Problem: Safety: Goal: Periods of time without injury will increase Outcome: Progressing

## 2017-12-04 LAB — VALPROIC ACID LEVEL: VALPROIC ACID LVL: 105 ug/mL — AB (ref 50.0–100.0)

## 2017-12-04 MED ORDER — LISINOPRIL 10 MG PO TABS: 10.0000 mg | ORAL_TABLET | Freq: Every day | ORAL | 0 refills | Status: AC

## 2017-12-04 MED ORDER — DULOXETINE HCL 30 MG PO CPEP: 30.0000 mg | ORAL_CAPSULE | Freq: Every day | ORAL | 0 refills | Status: DC

## 2017-12-04 MED ORDER — HYDROXYZINE HCL 50 MG PO TABS: 50.0000 mg | ORAL_TABLET | Freq: Three times a day (TID) | ORAL | 0 refills | Status: DC | PRN

## 2017-12-04 MED ORDER — DIVALPROEX SODIUM ER 250 MG PO TB24: 1250.0000 mg | ORAL_TABLET | Freq: Every day | ORAL | 1 refills | Status: DC

## 2017-12-04 MED ORDER — TRAZODONE HCL 100 MG PO TABS: 100.0000 mg | ORAL_TABLET | Freq: Every day | ORAL | 0 refills | Status: AC

## 2017-12-04 MED ORDER — OLANZAPINE 20 MG PO TABS: 20.0000 mg | ORAL_TABLET | Freq: Every day | ORAL | 1 refills | Status: DC

## 2017-12-04 MED ORDER — GABAPENTIN 300 MG PO CAPS: 300.0000 mg | ORAL_CAPSULE | Freq: Three times a day (TID) | ORAL | 0 refills | Status: DC

## 2017-12-04 MED ORDER — DIVALPROEX SODIUM ER 500 MG PO TB24
1250.0000 mg | ORAL_TABLET | Freq: Every day | ORAL | Status: DC
  Administered 2017-12-04: 1250 mg via ORAL
  Filled 2017-12-04 (×2): qty 1

## 2017-12-04 NOTE — Plan of Care (Signed)
Calm and cooperative. Compliant with treatment.  
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
## 2017-12-04 NOTE — Progress Notes (Signed)
Patient ID: Darius Campos., male   DOB: 10-08-1970, 47 y.o.   MRN: [id number] PER STATE REGULATIONS 482.30  THIS CHART WAS REVIEWED FOR MEDICAL NECESSITY WITH RESPECT TO THE PATIENT'S ADMISSION/ DURATION OF STAY.  NEXT REVIEW DATE: 12/08/2017  Willa Rough, RN, BSN CASE MANAGER

## 2017-12-04 NOTE — Plan of Care (Signed)
Patient is alert and oriented x 4, ambulatory with a steady gait. Complained of bilateral feet pain. PRN Mobic admininistered with effectiveness. Affect is pleasant today, compliant with his treatment plan an states, "I am excited to be discharging tomorrow, I have a better outlook on life and I got some rest, that's all I really needed." Denies SI/HI/AVH and pain at this time. Milieu remains safe with q 15 minute safety checks.

## 2017-12-04 NOTE — Progress Notes (Signed)
[facility] MD Progress Note  12/04/2017 1:18 PM Darius Campos.  MRN:  [id number] Subjective:  PT has been calm and cooperative on the unit. He states, "I've been thinking more about the future and how things can be better." He states, "I want to put things behind me." When asked what has changed from admission, he states, "The way I look at things. 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 HE has been sleeping well and he feels very well rested. He is going to groups. HE is eating well. HE is very calm and pleasant during interaction. He is smiling and well groomed. He asks for a referral to a psychiatrist in [place] since he will be staying with his daughter. He denies SI or Homicidal thoughts. He is organized in thoughts. He does not bring up any delusional thought content.   Principal Problem: Schizoaffective disorder, bipolar type (HCC) Diagnosis:   Patient Active Problem List   Diagnosis Date Noted  . Schizoaffective disorder, bipolar type (HCC) [F25.0] 11/30/2017    Priority: High  . Schizoaffective disorder (HCC) [F25.9] 11/30/2017  . Chronic constipation [K59.09] 06/22/2017  . NSAID long-term use [Z79.1] 06/22/2017  . Hypertension goal BP (blood pressure) < 130/80 [I10] 06/22/2017  . Lumbar degenerative disc disease [M51.36] 04/15/2017  . Schizoaffective disorder, depressive type (HCC) [F25.1] 04/09/2017  . Encounter for medication monitoring [Z51.81] 04/09/2017  . Abdominal pain, chronic, left lower quadrant [R10.32, G89.29] 04/09/2017  . Hematochezia [K92.1] 04/09/2017  . Tobacco use disorder [F17.200] 04/09/2017  . 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 PSC (posterior subcapsular cataract), left [IMO0002] 07/13/2014  . IFG (impaired fasting glucose) [R73.01]  03/10/2013   Total Time spent with patient: 20 minutes  Past Psychiatric History: See H&P  Past Medical History:  Past Medical History:  Diagnosis Date  . Cataract cortical, senile, left 07/11/2014  . Constipation   . Hypertension goal BP (blood pressure) < 130/80 06/22/2017  . IFG (impaired fasting glucose) 03/10/2013  . Lumbar degenerative disc disease 04/15/2017  . Paranoid schizophrenia (HCC)   . Schizoaffective disorder, depressive type (HCC) 04/09/2017  . Tobacco use     Past Surgical History:  Procedure Laterality Date  . APPENDECTOMY     Family History:  Family History  Problem Relation Age of Onset  . Diabetes Mother   . Hypertension Father    Family Psychiatric  History: See H&P Social History:  Social History   Substance and Sexual Activity  Alcohol Use No  . Frequency: Never     Social History   Substance and Sexual Activity  Drug Use Yes  . Types: Marijuana    Social History   Socioeconomic History  . Marital status: Single    Spouse name: Not on file  . Number of children: Not on file  . Years of education: Not on file  . Highest education level: Not on file  Occupational History  . Not on file  Social Needs  . Financial resource strain: Patient refused  . Food insecurity:    Worry: Patient refused    Inability: Patient refused  . Transportation needs:    Medical: Patient refused    Non-medical: Patient refused  Tobacco Use  . Smoking status: Current Every Day Smoker  Packs/day: 1.50    Years: 30.00    Pack years: 45.00    Types: Cigarettes  . Smokeless tobacco: Never Used  Substance and Sexual Activity  . Alcohol use: No    Frequency: Never  . Drug use: Yes    Types: Marijuana  . Sexual activity: Yes    Birth control/protection: None  Lifestyle  . Physical activity:    Days per week: Patient refused    Minutes per session: Patient refused  . Stress: Not on file  Relationships  . Social connections:    Talks on phone: Patient  refused    Gets together: Patient refused    Attends religious service: Patient refused    Active member of club or organization: Patient refused    Attends meetings of clubs or organizations: Patient refused    Relationship status: Patient refused  Other Topics Concern  . Not on file  Social History Narrative  . Not on file   Additional Social History:    Pain Medications: Refused  Prescriptions: refused Over the Counter: refused History of alcohol / drug use?: No history of alcohol / drug abuse Longest period of sobriety (when/how long): refused Negative Consequences of Use: (refused) Withdrawal Symptoms: (refused)                    Sleep: Good  Appetite:  Good  Current Medications: Current Facility-Administered Medications  Medication Dose Route Frequency Provider Last Rate Last Dose  . acetaminophen (TYLENOL) tablet 650 mg  650 mg Oral Q6H PRN He, Jun, MD      . diphenhydrAMINE (BENADRYL) capsule 50 mg  50 mg Oral Q6H PRN Pucilowska, Jolanta B, MD       Or  . diphenhydrAMINE (BENADRYL) injection 50 mg  50 mg Intramuscular Q6H PRN Pucilowska, Jolanta B, MD      . divalproex (DEPAKOTE ER) 24 hr tablet 1,500 mg  1,500 mg Oral QHS Glory Graefe, Ileene Hutchinson, MD   1,500 mg at 12/03/17 2115  . DULoxetine (CYMBALTA) DR capsule 30 mg  30 mg Oral Daily Levone Otten, Ileene Hutchinson, MD   30 mg at 12/04/17 0805  . gabapentin (NEURONTIN) capsule 300 mg  300 mg Oral TID Haskell Riling, MD   300 mg at 12/04/17 1202  . haloperidol (HALDOL) tablet 5 mg  5 mg Oral Q6H PRN Remee Charley, Ileene Hutchinson, MD       Or  . haloperidol lactate (HALDOL) injection 5 mg  5 mg Intramuscular Q6H PRN Sally Menard, Ileene Hutchinson, MD      . hydrOXYzine (ATARAX/VISTARIL) tablet 50 mg  50 mg Oral TID PRN He, Jun, MD      . linaclotide Karlene Einstein) capsule 145 mcg  145 mcg Oral QAC breakfast He, Jun, MD   145 mcg at 12/04/17 0805  . lisinopril (PRINIVIL,ZESTRIL) tablet 10 mg  10 mg Oral Daily He, Jun, MD   10 mg at 12/04/17 0805  . LORazepam (ATIVAN)  tablet 1 mg  1 mg Oral Q6H PRN Haskell Riling, MD   1 mg at 12/03/17 2119   Or  . LORazepam (ATIVAN) injection 1 mg  1 mg Intramuscular Q6H PRN Erskin Zinda, Ileene Hutchinson, MD      . magnesium hydroxide (MILK OF MAGNESIA) suspension 30 mL  30 mL Oral Daily PRN He, Jun, MD      . meloxicam (MOBIC) tablet 15 mg  15 mg Oral BID PRN Haskell Riling, MD   15 mg at 12/04/17 1219  . OLANZapine (  ZYPREXA) tablet 20 mg  20 mg Oral QHS Tiler Brandis, Ileene Hutchinson, MD   20 mg at 12/03/17 2116  . pantoprazole (PROTONIX) EC tablet 40 mg  40 mg Oral Daily He, Jun, MD   40 mg at 12/04/17 0805  . traZODone (DESYREL) tablet 100 mg  100 mg Oral QHS Carleen Rhue, Ileene Hutchinson, MD   100 mg at 12/03/17 2115  . traZODone (DESYREL) tablet 50 mg  50 mg Oral QHS PRN He, Jun, MD   50 mg at 12/02/17 2131    Lab Results:  Results for orders placed or performed during the hospital encounter of 11/30/17 (from the past 48 hour(s))  Valproic acid level     Status: Abnormal   Collection Time: 12/04/17  7:35 AM  Result Value Ref Range   Valproic Acid Lvl 105 (H) 50.0 - 100.0 ug/mL    Comment: Performed at [facility], [address] [place], [place] [address]    Blood Alcohol level:  Lab Results  Component Value Date   [facility] <10 11/30/2017    Metabolic Disorder Labs: Lab Results  Component Value Date   HGBA1C 5.5 11/30/2017   MPG 111.15 11/30/2017   No results found for: PROLACTIN Lab Results  Component Value Date   CHOL 122 11/30/2017   TRIG 74 11/30/2017   HDL 39 (L) 11/30/2017   CHOLHDL 3.1 11/30/2017   VLDL 15 11/30/2017   LDLCALC 68 11/30/2017   LDLCALC 60 04/09/2017    Physical Findings: AIMS:  , ,  ,  ,    CIWA:    COWS:     Musculoskeletal: Strength & Muscle Tone: within normal limits Gait & Station: normal Patient leans: N/A  Psychiatric Specialty Exam: Physical Exam  Nursing note and vitals reviewed.   Review of Systems  All other systems reviewed and are negative.   Blood pressure 104/72, pulse 81,  temperature 98.6 F (37 C), temperature source Oral, resp. rate 18, height 5\' 10"  (1.778 m), weight 67 kg, SpO2 98 %.Body mass index is 21.19 kg/m.  General Appearance: Casual  Eye Contact:  Good  Speech:  Clear and Coherent  Volume:  Normal  Mood:  Euthymic  Affect:  Appropriate  Thought Process:  Coherent and Goal Directed  Orientation:  Full (Time, Place, and Person)  Thought Content:  Logical  Suicidal Thoughts:  No  Homicidal Thoughts:  No  Memory:  Immediate;   Fair  Judgement:  Fair  Insight:  Fair  Psychomotor Activity:  Normal  Concentration:  Concentration: Fair  Recall:  [employer] of Knowledge:  Fair  Language:  Fair  Akathisia:  No      Assets:  Resilience  ADL's:  Intact  Cognition:  WNL  Sleep:  Number of Hours: 8     Treatment Plan Summary: 47 yo male admitted due to agitation and paranoia. Thoughts are much improved. He is no longer agitated and is much calmer and appropriate. Hygiene is good. HE is interacting well with staff and peers. He is sleeping well and medication compliant.   Plan:  Schizoaffective disorder -Continue Zyprexa 20 mg qhs -Continue Depakote ER 1500 mg qhs. Level 105.Will lower dose to 1250 mg although level not quite trough level -Continue Cymbalta 30 mg daily  HTN -BP good today  Dispo likely discharge home tomorrow  Haskell Riling, MD 12/04/2017, 1:18 PM

## 2017-12-04 NOTE — Progress Notes (Signed)
Patient attended evening activities. Alert and oriented x 4. Calm and cooperative. Denying thoughts of self harm. Denying hallucinations. Expressing remorse about the behavior that precipitated his admission. Patient presented to the medication room, had his HS medications and had no other concerns. Went to bed and has been sleeping. No sign of distress. Support and encouragements provided. Safety precautions maintained.

## 2017-12-04 NOTE — Progress Notes (Signed)
Recreation Therapy Notes  Date: 12/04/2017  Time: 9:30 am  Location: Craft Room  Behavioral response: Appropriate    Intervention Topic: Coping  Discussion/Intervention:  Group content on today was focused on coping skills. The group defined what coping skills are and when they can be used. Individuals described how they normally cope with things and the coping skills they normally use. Patients expressed why it is important to cope with things and how not coping with things can affect you. The group participated in the intervention "My coping box" and made coping boxes while adding coping skills they could use in the future to the box.  Clinical Observations/Feedback:  Patient came to group late due to unknown reasons. Individual was social with peers and staff while participating in the intervention.  Niurka Benecke LRT/CTRS         Lanelle Lindo 12/04/2017 11:29 AM

## 2017-12-05 NOTE — Progress Notes (Signed)
Recreation Therapy Notes   Date: 12/05/2017  Time: 9:30 am  Location: Craft Room  Behavioral response: Appropriate    Intervention Topic: Happiness  Discussion/Intervention:  Group content today was focused on Happiness. The group defined happiness and stated reasons they are and are not happy at times. Participants identified reasons they are normally happy and why. Individuals expressed how not being happy affects themselves and others. Patients stated reasons why happiness is important to them. The group described how they feel when they are happy. Individuals participated in the intervention "What is happiness" where they defined what happiness means to them.   Clinical Observations/Feedback:  Patient came to group and defined happiness as a walk on the beach and watching a sun set. He was social with peers and staff while participating in the intervention.    Mayan Dolney LRT/CTRS          Ashtyn Freilich 12/05/2017 1:52 PM

## 2017-12-05 NOTE — BHH Suicide Risk Assessment (Signed)
[facility] Discharge Suicide Risk Assessment   Principal Problem: Schizoaffective disorder, bipolar type [facility]) Discharge Diagnoses:  Patient Active Problem List   Diagnosis Date Noted  . Schizoaffective disorder, bipolar type (HCC) [F25.0] 11/30/2017    Priority: High  . Schizoaffective disorder (HCC) [F25.9] 11/30/2017  . Chronic constipation [K59.09] 06/22/2017  . NSAID long-term use [Z79.1] 06/22/2017  . Hypertension goal BP (blood pressure) < 130/80 [I10] 06/22/2017  . Lumbar degenerative disc disease [M51.36] 04/15/2017  . Schizoaffective disorder, depressive type (HCC) [F25.1] 04/09/2017  . Encounter for medication monitoring [Z51.81] 04/09/2017  . Abdominal pain, chronic, left lower quadrant [R10.32, G89.29] 04/09/2017  . Hematochezia [K92.1] 04/09/2017  . Tobacco use disorder [F17.200] 04/09/2017  . 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 PSC (posterior subcapsular cataract), left [IMO0002] 07/13/2014  . IFG (impaired fasting glucose) [R73.01] 03/10/2013      Mental Status Per Nursing Assessment::   On Admission:  Thoughts of violence towards others  Demographic Factors:  Male and Caucasian  Loss Factors: Conflict with brother  Historical Factors: Impulsivity  Risk Reduction Factors:   Positive therapeutic relationship and Positive coping skills or problem solving skills  Continued Clinical Symptoms:  Schizophrenia:   Paranoid or undifferentiated type Previous Psychiatric Diagnoses and Treatments  Cognitive Features That Contribute To Risk:  None    Suicide Risk:  Minimal: No identifiable suicidal ideation.  Patients presenting with no risk factors but with morbid ruminations; may be classified as minimal risk based on the severity of the depressive symptoms  Follow-up Information    Services, [facility]. Go on 12/08/2017.   Why:  Please follow up with [facility] on Monday December 08, 2017 at 8am. Please bring your identification,  insurance card and all medications that you are taking. Thank you. Contact information: [address] [place] [address] [phone]            Haskell Riling, MD 12/05/2017, 9:08 AM

## 2017-12-05 NOTE — Plan of Care (Signed)
Cooperative and compliant with treatment 

## 2017-12-05 NOTE — Progress Notes (Signed)
Recreation Therapy Notes  INPATIENT RECREATION TR PLAN  Patient Details Name: Darius Campos. MRN: [id number] DOB: 28-Oct-1970 Today's Date: 12/05/2017  Rec Therapy Plan Is patient appropriate for Therapeutic Recreation?: Yes Treatment times per week: at least 3 Estimated Length of Stay: 5-7 days TR Treatment/Interventions: Group participation (Comment)  Discharge Criteria Pt will be discharged from therapy if:: Discharged Treatment plan/goals/alternatives discussed and agreed upon by:: Patient/family  Discharge Summary Short term goals set:  Patient will engage in groups with a calm and appropriate mood at least 2x within 5 recreation therapy group sessions Short term goals met: Complete Progress toward goals comments: Groups attended Which groups?: Coping skills, Other (Comment)(Happiness, Team work) Reason goals not met: N/A Therapeutic equipment acquired: N/A Reason patient discharged from therapy: Discharge from hospital Pt/family agrees with progress & goals achieved: Yes Date patient discharged from therapy: 12/05/17   Denis Koppel 12/05/2017, 1:56 PM
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
## 2017-12-05 NOTE — Progress Notes (Signed)
Patient was active in the milieu, pleasant and compliant. Improving in mood and expressing remorse related to behaviors that precipitated his admissions. Willing to discuss with MD regarding needed prescriptions upon discharge. Compliant with medications and other unit expectations. Went to bed after medication pass and has been resting. No sign of distress. Staff continue to monitor for safety.

## 2017-12-05 NOTE — Progress Notes (Signed)
  [facility] Adult Case Management Discharge Plan :  Will you be returning to the same living situation after discharge:  No. Will be staying with daughter in [place]. At discharge, do you have transportation home?: No. Pt will call Uber/One ride service. Do you have the ability to pay for your medications: Yes,  Medicare  Release of information consent forms completed and in the chart;  Patient's signature needed at discharge.  Patient to Follow up at: Follow-up Information    [facility]. Go on 12/08/2017.   Why:  Please follow up with [facility] on Monday December 08, 2017 at 8am. Please bring your identification, insurance card and all medications that you are taking. Thank you. Contact information: [address] [place] [address] [phone]           Next level of care provider has access to [facility] Link:no  Safety Planning and Suicide Prevention discussed: Yes,  with brother  Have you used any form of tobacco in the last 30 days? (Cigarettes, Smokeless Tobacco, Cigars, and/or Pipes): Patient Refused Screening  Has patient been referred to the Quitline?: Patient refused referral  Patient has been referred for addiction treatment: Yes  Lorri Frederick, LCSW 12/05/2017, 9:30 AM

## 2017-12-05 NOTE — Progress Notes (Signed)
Patient is alert and oriented X 4, Patient denies SI, HI and AVH. Patient rates pain 0/10. Patient received paper prescription with name verification. Patient also received belongings from locker ( pants, shoe strings). Patient received belongings from safe ( wallet, keys and debit card). Patient signed for belongings. Patient thankful for treatment team. Patient escorted to visitor entrance where brother waited to take patient home.
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
## 2019-05-04 ENCOUNTER — Ambulatory Visit (INDEPENDENT_AMBULATORY_CARE_PROVIDER_SITE_OTHER): Payer: Medicare (Managed Care) | Admitting: Medical-Surgical

## 2019-05-04 ENCOUNTER — Other Ambulatory Visit: Payer: Self-pay

## 2019-05-04 ENCOUNTER — Encounter: Payer: Self-pay | Admitting: Medical-Surgical

## 2019-05-04 VITALS — BP 160/95 | HR 82 | Temp 98.0°F | Ht 69.0 in | Wt 158.0 lb

## 2019-05-04 DIAGNOSIS — Z5181 Encounter for therapeutic drug level monitoring: Secondary | ICD-10-CM

## 2019-05-04 DIAGNOSIS — Z7689 Persons encountering health services in other specified circumstances: Secondary | ICD-10-CM | POA: Diagnosis not present

## 2019-05-04 NOTE — Progress Notes (Addendum)
Subjective:    CC: establish care, medication refills  HPI: 49 year old male presenting today to establish care and to discuss medications. Has sought care at 3-4 practices over the past 18 months. Reports he is trying to find a doctor that will give him the medication he "is supposed to have". Currently, prescribed Diazepam 10mg  q day prn by Dr. , last refill 04/26/2019. Reports this medication is ineffective and he can't feel that it helps. Previously taking Alprazolam and prefers this medication. Last prescribed Alprazolam 07/2018.  Disputes schizoaffective disorder diagnoses in chart stating that he has had a lot of things happen in his life and he has had PTSD.  Reports that he has requested for his medical records to be expunged.  Participated in counseling sessions years ago but none recently.  Not interested in reestablishing a counseling relationship.  Adamantly disinterested in returning to psychiatry for reevaluation.  States "if you are not going to give me the alprazolam, then I have wasted a trip".   I reviewed the past medical history, family history, social history, surgical history, and allergies today and no changes were needed.  Please see the problem list section below in epic for further details.  Past Medical History: Past Medical History:  Diagnosis Date  . Cataract cortical, senile, left 07/11/2014  . Constipation   . Hypertension goal BP (blood pressure) < 130/80 06/22/2017  . IFG (impaired fasting glucose) 03/10/2013  . Lumbar degenerative disc disease 04/15/2017  . Paranoid schizophrenia (H[facility])   . Schizoaffective disorder, depressive type ([facility]) 04/09/2017  . Tobacco use    Past Surgical History: Past Surgical History:  Procedure Laterality Date  . APPENDECTOMY     Social History: Social History   Socioeconomic History  . Marital status: Single    Spouse name: Not on file  . Number of children: Not on file  . Years of education: Not on file  . Highest  education level: Not on file  Occupational History  . Not on file  Tobacco Use  . Smoking status: Current Every Day Smoker    Packs/day: 1.50    Years: 30.00    Pack years: 45.00    Types: Cigarettes  . Smokeless tobacco: Never Used  Substance and Sexual Activity  . Alcohol use: No  . Drug use: Yes    Types: Marijuana  . Sexual activity: Yes    Birth control/protection: None  Other Topics Concern  . Not on file  Social History Narrative  . Not on file   Social Determinants of Health   Financial Resource Strain:   . Difficulty of Paying Living Expenses: Not on file  Food Insecurity:   . Worried About 06/07/2017 in the Last Year: Not on file  . Ran Out of Food in the Last Year: Not on file  Transportation Needs:   . Lack of Transportation (Medical): Not on file  . Lack of Transportation (Non-Medical): Not on file  Physical Activity:   . Days of Exercise per Week: Not on file  . Minutes of Exercise per Session: Not on file  Stress:   . Feeling of Stress : Not on file  Social Connections:   . Frequency of Communication with Friends and Family: Not on file  . Frequency of Social Gatherings with Friends and Family: Not on file  . Attends Religious Services: Not on file  . Active Member of Clubs or Organizations: Not on file  . Attends Programme researcher, broadcasting/film/video Meetings: Not  on file  . Marital Status: Not on file   Family History: Family History  Problem Relation Age of Onset  . Diabetes Mother   . Hypertension Father    Allergies: Allergies  Allergen Reactions  . Bee Venom Anaphylaxis  . Diclofenac Sodium     GI Bleed   Medications: See med rec.  Review of Systems: No fevers, chills, night sweats, weight loss, chest pain, or shortness of breath.   Objective:    General: Well Developed, well nourished, and in no acute distress.  Neuro: Alert and oriented x3.  HEENT: Normocephalic, atraumatic.  Skin: Warm and dry. Cardiac: Regular rate and rhythm.   Respiratory: Not using accessory muscles, speaking in full sentences.   Impression and Recommendations:    Encounter for medication monitoring Much discussion regarding the use of alprazolam and its intention as a short-term medication.  Encouraged reevaluation with psychiatry-patient declined.  With recent refill of diazepam on 04/26/2019 and unwillingness to engage with psychiatry, no change in medications made today.   Return if symptoms worsen or fail to improve.  ___________________________________________ Clearnce Sorrel, DNP, APRN, FNP-BC [facility] [place]
